# Patient Record
Sex: Female | Born: 2000 | Race: Black or African American | Hispanic: No | Marital: Single | Smoking: Never smoker
Health system: Southern US, Community
[De-identification: ages and names within clinical notes are randomized; demographics above are authoritative.]

## PROBLEM LIST (undated history)

## (undated) DIAGNOSIS — A749 Chlamydial infection, unspecified: Secondary | ICD-10-CM

## (undated) DIAGNOSIS — Z789 Other specified health status: Secondary | ICD-10-CM

## (undated) HISTORY — PX: NO PAST SURGERIES: SHX2092

---
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
## 2018-04-23 ENCOUNTER — Ambulatory Visit (HOSPITAL_COMMUNITY)
Admission: EM | Admit: 2018-04-23 | Discharge: 2018-04-23 | Disposition: A | Discharge status: Home / Self Care | Payer: Medicaid Other | Attending: Internal Medicine | Admitting: Internal Medicine

## 2018-04-23 ENCOUNTER — Encounter (HOSPITAL_COMMUNITY): Payer: Self-pay | Admitting: Emergency Medicine

## 2018-04-23 DIAGNOSIS — J069 Acute upper respiratory infection, unspecified: Secondary | ICD-10-CM

## 2018-04-23 DIAGNOSIS — B9789 Other viral agents as the cause of diseases classified elsewhere: Secondary | ICD-10-CM | POA: Diagnosis not present

## 2018-04-23 MED ORDER — IBUPROFEN 600 MG PO TABS: 600.0000 mg | ORAL_TABLET | Freq: Four times a day (QID) | ORAL | 0 refills | Status: DC | PRN

## 2018-04-23 NOTE — Discharge Instructions (Signed)
Get plenty of rest and push fluids You may use OTC zyrtec and/or flonase as needed for nasal congestion, runny nose, and/or sore throat Ibuprofen prescribed.  Take as directed.   Follow up with PCP or [facility] if symptoms persist Return or go to ER if you have any new or worsening symptoms fever, chills, nausea, vomiting, chest pain, cough, shortness of breath, wheezing, abdominal pain, changes in bowel or bladder habits, etc...  Heart rate also elevated in office today.  This could be secondary to viral illness and dehydration.  Please increase your water intake and follow up with PCP/ [facility] in 1-2 weeks for recheck.

## 2018-04-23 NOTE — ED Triage Notes (Signed)
Pt c/o flu like ysmptoms x1 week, body aches, chills, cough.

## 2018-04-23 NOTE — ED Provider Notes (Signed)
[facility]   [id number] 04/23/18 Arrival Time: 1712   CC: URI symptoms   SUBJECTIVE: History from: patient.  Vicki Harmon is a 18 y.o. female who presents with abrupt onset of nasal congestion, runny nose, cough, body aches, myalgias, subjective fever and chills x 3 days.  Admits to sick exposure at home.  Has tried mucinex without relief.  Denies aggravating factors.  Denies previous symptoms in the past.   Denies sinus pain, SOB, wheezing, chest pain, changes in bowel or bladder habits.    Received flu shot this year: no.  ROS: As per HPI.  Past Medical History:  Diagnosis Date  . Chlamydia   . Medical history non-contributory    Past Surgical History:  Procedure Laterality Date  . NO PAST SURGERIES     Allergies  Allergen Reactions  . Shellfish Allergy Swelling   No current facility-administered medications on file prior to encounter.    Current Outpatient Medications on File Prior to Encounter  Medication Sig Dispense Refill  . acetaminophen (TYLENOL) 500 MG tablet Take 1,000 mg by mouth every 6 (six) hours as needed for moderate pain or headache.     Social History   Socioeconomic History  . Marital status: Single    Spouse name: Not on file  . Number of children: Not on file  . Years of education: Not on file  . Highest education level: Not on file  Occupational History  . Not on file  Social Needs  . Financial resource strain: Not on file  . Food insecurity:    Worry: Not on file    Inability: Not on file  . Transportation needs:    Medical: Not on file    Non-medical: Not on file  Tobacco Use  . Smoking status: Never Smoker  . Smokeless tobacco: Never Used  Substance and Sexual Activity  . Alcohol use: No  . Drug use: Yes    Types: Marijuana    Comment: Quit in July  . Sexual activity: Never    Birth control/protection: Implant  Lifestyle  . Physical activity:    Days per week: Not on file    Minutes per session: Not on file  .  Stress: Not on file  Relationships  . Social connections:    Talks on phone: Not on file    Gets together: Not on file    Attends religious service: Not on file    Active member of club or organization: Not on file    Attends meetings of clubs or organizations: Not on file    Relationship status: Not on file  . Intimate partner violence:    Fear of current or ex partner: Not on file    Emotionally abused: Not on file    Physically abused: Not on file    Forced sexual activity: Not on file  Other Topics Concern  . Not on file  Social History Narrative  . Not on file   Family History  Problem Relation Age of Onset  . Hyperlipidemia Mother     OBJECTIVE:  Vitals:   04/23/18 1757  BP: (!) 105/56  Pulse: (!) 115  Resp: 18  Temp: 98.1 F (36.7 C)  SpO2: 100%     General appearance: alert; appears mildly fatigued, but nontoxic; speaking in full sentences and tolerating own secretions HEENT: NCAT; Ears: EACs clear, TMs pearly gray; Eyes: PERRL.  EOM grossly intact. Nose: nares patent without rhinorrhea, turbinates swollen and erythematous; Throat: oropharynx clear, tonsils non  erythematous or enlarged, uvula midline  Neck: supple without LAD Lungs: unlabored respirations, symmetrical air entry; cough: mild; no respiratory distress; CTAB Heart: tachycardic.  Radial pulses 2+ symmetrical bilaterally Skin: warm and dry Psychological: alert and cooperative; normal mood and affect  ASSESSMENT & PLAN:  1. Viral URI with cough     Meds ordered this encounter  Medications  . ibuprofen (ADVIL,MOTRIN) 600 MG tablet    Sig: Take 1 tablet (600 mg total) by mouth every 6 (six) hours as needed for fever (body aches, and pain).    Dispense:  30 tablet    Refill:  0    Order Specific Question:   Supervising Provider    Answer:   Eustace MooreELSON, YVONNE SUE [[id number]]   Get plenty of rest and push fluids You may use OTC zyrtec and/or flonase as needed for nasal congestion, runny nose, and/or  sore throat Ibuprofen prescribed.  Take as directed.   Follow up with PCP or [facility] if symptoms persist Return or go to ER if you have any new or worsening symptoms fever, chills, nausea, vomiting, chest pain, cough, shortness of breath, wheezing, abdominal pain, changes in bowel or bladder habits, etc...  Heart rate also elevated in office today.  This could be secondary to viral illness and dehydration.  Please increase your water intake and follow up with PCP/ [facility] in 1-2 weeks for recheck.   Reviewed expectations re: course of current medical issues. Questions answered. Outlined signs and symptoms indicating need for more acute intervention. Patient verbalized understanding. After Visit Summary given.         Rennis HardingWurst, [name], PA-C 04/23/18 1847

## 2018-11-15 ENCOUNTER — Emergency Department (HOSPITAL_COMMUNITY)
Admission: EM | Admit: 2018-11-15 | Discharge: 2018-11-15 | Discharge status: Absent without leave | Payer: Medicaid Other

## 2019-03-28 ENCOUNTER — Ambulatory Visit (HOSPITAL_COMMUNITY)
Admission: EM | Admit: 2019-03-28 | Discharge: 2019-03-28 | Disposition: A | Discharge status: Home / Self Care | Payer: Medicaid Other | Attending: Urgent Care | Admitting: Urgent Care

## 2019-03-28 ENCOUNTER — Encounter (HOSPITAL_COMMUNITY): Payer: Self-pay

## 2019-03-28 ENCOUNTER — Other Ambulatory Visit: Payer: Self-pay

## 2019-03-28 DIAGNOSIS — Z3202 Encounter for pregnancy test, result negative: Secondary | ICD-10-CM | POA: Diagnosis not present

## 2019-03-28 DIAGNOSIS — N921 Excessive and frequent menstruation with irregular cycle: Secondary | ICD-10-CM | POA: Insufficient documentation

## 2019-03-28 LAB — POCT URINALYSIS DIP (DEVICE)
Glucose, UA: 100 mg/dL — AB
Leukocytes,Ua: NEGATIVE
Nitrite: NEGATIVE
Protein, ur: 100 mg/dL — AB
Specific Gravity, Urine: 1.025 (ref 1.005–1.030)
Urobilinogen, UA: 1 mg/dL (ref 0.0–1.0)
pH: 7 (ref 5.0–8.0)

## 2019-03-28 LAB — POC URINE PREG, ED: Preg Test, Ur: NEGATIVE

## 2019-03-28 LAB — POCT PREGNANCY, URINE: Preg Test, Ur: NEGATIVE

## 2019-03-28 MED ORDER — NAPROXEN 500 MG PO TABS: 500.0000 mg | ORAL_TABLET | Freq: Two times a day (BID) | ORAL | 0 refills | Status: DC

## 2019-03-28 NOTE — Discharge Instructions (Addendum)
Call centers for women to follow for further evaluation. If symptoms worsens and you are soaking 1 pad per hour, go immediately to the emergency department for evaluation. Other take medication as prescribed and hydrate well with water. Results of your labs will go immediately to your Mychart.

## 2019-03-28 NOTE — ED Provider Notes (Signed)
[facility]    CSN: [id number] Arrival date & time: 03/28/19  1342      History   Chief Complaint Chief Complaint  Patient presents with  . Menstrual Problem    HPI Vicki Harmon is a 19 y.o. female.   HPI  Vicki Harmon presents with a concern for abnormal vaginal bleeding.  Patient reports irregular menstrual cycle due to implantable birth control. She reports having a menstrual cycle approximately every other month. Her currently period started eight days ago. She is concern as bleeding is heavier than usual and bleeding lasting 8 days oppose to 4-5 days. At present, she reports use of 4 regular pads per day. She is experiencing cramping lower abdomen consist with menstrual cramps. She has not taken any ibuprofen or tylenol for cramping. She is sexually active without consistent use of barrier protection. Denies nausea, vomiting, diarrhea, dizziness, or weakness. Past Medical History:  Diagnosis Date  . Chlamydia   . Medical history non-contributory     Patient Active Problem List   Diagnosis Date Noted  . Headache disorder 10/30/2017    Past Surgical History:  Procedure Laterality Date  . NO PAST SURGERIES      OB History    Gravida  1   Para      Term      Preterm      AB      Living  0     SAB      TAB      Ectopic      Multiple      Live Births               Home Medications    Prior to Admission medications   Medication Sig Start Date End Date Taking? Authorizing Provider  acetaminophen (TYLENOL) 500 MG tablet Take 1,000 mg by mouth every 6 (six) hours as needed for moderate pain or headache.    [provider]  ibuprofen (ADVIL,MOTRIN) 600 MG tablet Take 1 tablet (600 mg total) by mouth every 6 (six) hours as needed for fever (body aches, and pain). 04/23/18   Rennis Harding, PA-C    Family History Family History  Problem Relation Age of Onset  . Hyperlipidemia Mother   . Hypertension Mother     Social  History Social History   Tobacco Use  . Smoking status: Never Smoker  . Smokeless tobacco: Never Used  Substance Use Topics  . Alcohol use: Yes    Comment: occ  . Drug use: Yes    Types: Marijuana    Comment: Quit in July     Allergies   Shellfish allergy Review of Systems Review of Systems Pertinent negatives listed in HPI Physical Exam Triage Vital Signs ED Triage Vitals  Enc Vitals Group     BP 03/28/19 1415 125/89     Pulse Rate 03/28/19 1415 85     Resp 03/28/19 1415 18     Temp 03/28/19 1415 98.3 F (36.8 C)     Temp Source 03/28/19 1415 Oral     SpO2 03/28/19 1415 100 %     Weight --      Height --      Head Circumference --      Peak Flow --      Pain Score 03/28/19 1413 6     Pain Loc --      Pain Edu? --      Excl. in GC? --    No data  found.  Updated Vital Signs BP 125/89 (BP Location: Left Arm)   Pulse 85   Temp 98.3 F (36.8 C) (Oral)   Resp 18   SpO2 100%   Visual Acuity Right Eye Distance:   Left Eye Distance:   Bilateral Distance:    Right Eye Near:   Left Eye Near:    Bilateral Near:     Physical Exam General appearance: alert, well developed, well nourished, cooperative and in no distress Head: Normocephalic, without obvious abnormality, atraumatic Respiratory: Respirations even and unlabored, normal respiratory rate Heart: rate and rhythm normal. No gallop or murmurs noted on exam  Abdomen: BS +, no distention, no rebound tenderness, no mass, no CVA tenderness Extremities: No gross deformities Skin: Skin color, texture, turgor normal. No rashes seen  Psych: Appropriate mood and affect. Neurologic: Alert, oriented to person, place, and time, thought content appropriate. UC Treatments / Results  Labs (all labs ordered are listed, but only abnormal results are displayed) Labs Reviewed  POCT URINALYSIS DIP (DEVICE) - Abnormal; Notable for the following components:      Result Value   Glucose, UA 100 (*)    Bilirubin Urine  SMALL (*)    Ketones, ur TRACE (*)    Hgb urine dipstick LARGE (*)    Protein, ur 100 (*)    All other components within normal limits  POC URINE PREG, ED  POC URINE PREG, ED  POCT PREGNANCY, URINE  CERVICOVAGINAL ANCILLARY ONLY    EKG   Radiology No results found.  Procedures Procedures (including critical care time)  Medications Ordered in UC Medications - No data to display  Initial Impression / Assessment and Plan / UC Course  I have reviewed the triage vital signs and the nursing notes.  Pertinent labs & imaging results that were available during my care of the patient were reviewed by me and considered in my medical decision making (see chart for details).   Abnormal menstrual cycle with heavy prolonged bleeding. Screened for STDs. Patient has experienced irregularity of menstrual cycle since placement of nexplanon. Bleeding is mainly heavy and clot like when patient uses the restroom after further inquiry. Urine pregnancy negative. STD screening pending. Naprosxyn prescribed BID PRN for abdominal cramping. Pt is [facility] with [facility], encouraged to call an schedule an appointment as patient would like nexplanon removed. Reassurance provided. Red flags discussed. Patient verbalized understanding and agreement with plan. Final Clinical Impressions(s) / UC Diagnoses   Final diagnoses:  Menorrhagia with irregular cycle     Discharge Instructions     Call centers for women to follow for further evaluation. If symptoms worsens and you are soaking 1 pad per hour, go immediately to the emergency department for evaluation. Other take medication as prescribed and hydrate well with water. Results of your labs will go immediately to your Mychart.    ED Prescriptions    Medication Sig Dispense Auth. Provider   naproxen (NAPROSYN) 500 MG tablet Take 1 tablet (500 mg total) by mouth 2 (two) times daily. 30 tablet Scot Jun, FNP     PDMP not reviewed this  encounter.   Scot Jun, [place] 03/30/19 [id number]

## 2019-03-28 NOTE — ED Triage Notes (Signed)
Patient presents to Urgent Care with complaints of irregular period since the past few weeks. Patient reports she has a nexplanon in place, is due to come out next year. Pt endorses blood clots and cramping in her lower back. Pt states she has been wearing her son's diapers to absorb the bleeding. Pt was told by someone on the Nexplanon card to come here for assessment of possible miscarriage. Pt endorses unprotected sex.

## 2019-03-29 LAB — URINE CULTURE
Culture: <10000 — AB
Special Requests: NORMAL

## 2019-04-01 LAB — CERVICOVAGINAL ANCILLARY ONLY
Bacterial vaginitis: POSITIVE — AB
Candida vaginitis: NEGATIVE
Chlamydia: POSITIVE — AB
Neisseria Gonorrhea: NEGATIVE
Trichomonas: NEGATIVE

## 2019-04-02 ENCOUNTER — Encounter (HOSPITAL_COMMUNITY): Payer: Self-pay

## 2019-04-02 ENCOUNTER — Telehealth (HOSPITAL_COMMUNITY): Payer: Self-pay | Admitting: Emergency Medicine

## 2019-04-02 MED ORDER — AZITHROMYCIN 250 MG PO TABS: 1000.0000 mg | ORAL_TABLET | Freq: Once | ORAL | 0 refills | Status: AC

## 2019-04-02 MED ORDER — METRONIDAZOLE 500 MG PO TABS: 500.0000 mg | ORAL_TABLET | Freq: Two times a day (BID) | ORAL | 0 refills | Status: AC

## 2019-04-02 NOTE — Telephone Encounter (Signed)
Chlamydia is positive.  Rx po zithromax 1g #1 dose no refills was sent to the pharmacy of record.  Please refrain from sexual intercourse for 7 days to give the medicine time to work, sexual partners need to be notified and tested/treated.  Condoms may reduce risk of reinfection.  Recheck or followup with PCP for further evaluation if symptoms are not improving.   [facility] notified.  Bacterial vaginosis is positive. Pt needs treatment. Flagyl 500 mg BID x 7 days #14 no refills sent to patients pharmacy of choice.    Attempted to reach patient. No answer at this time. No number on file working.

## 2019-04-06 ENCOUNTER — Telehealth (HOSPITAL_COMMUNITY): Payer: Self-pay | Admitting: Emergency Medicine

## 2019-04-06 NOTE — Telephone Encounter (Signed)
Attempted number again, no number working. Letter sent.

## 2019-05-08 ENCOUNTER — Encounter (HOSPITAL_COMMUNITY): Payer: Self-pay

## 2019-05-08 ENCOUNTER — Other Ambulatory Visit: Payer: Self-pay

## 2019-05-08 ENCOUNTER — Ambulatory Visit (HOSPITAL_COMMUNITY)
Admission: EM | Admit: 2019-05-08 | Discharge: 2019-05-08 | Disposition: A | Discharge status: Home / Self Care | Payer: Medicaid Other | Attending: Family Medicine | Admitting: Family Medicine

## 2019-05-08 DIAGNOSIS — Z113 Encounter for screening for infections with a predominantly sexual mode of transmission: Secondary | ICD-10-CM | POA: Diagnosis not present

## 2019-05-08 DIAGNOSIS — N898 Other specified noninflammatory disorders of vagina: Secondary | ICD-10-CM

## 2019-05-08 LAB — POCT URINALYSIS DIP (DEVICE)
Bilirubin Urine: NEGATIVE
Glucose, UA: NEGATIVE mg/dL
Hgb urine dipstick: NEGATIVE
Ketones, ur: NEGATIVE mg/dL
Leukocytes,Ua: NEGATIVE
Nitrite: NEGATIVE
Protein, ur: NEGATIVE mg/dL
Specific Gravity, Urine: >=1.03 (ref 1.005–1.030)
Urobilinogen, UA: 0.2 mg/dL (ref 0.0–1.0)
pH: 7 (ref 5.0–8.0)

## 2019-05-08 MED ORDER — FLUCONAZOLE 200 MG PO TABS: 200.0000 mg | ORAL_TABLET | Freq: Once | ORAL | 0 refills | Status: AC

## 2019-05-08 NOTE — ED Triage Notes (Signed)
Pt was here on 03/28/2019 for STD testing, she did test POSITIVE for BV, Chlymdia & she took all of her meds for treatment. States her partner came & was tested but did not follow treatment(take prescribed meds). She wants STD testing again & treatment, they last had unprotected sex 2 days ago.

## 2019-05-08 NOTE — Discharge Instructions (Addendum)
Do not have sex until results are back and negative.  Your STD tests are pending.  If your test results are positive, we will call you.  You may need additional treatment and your partner(s) may also need treatment.

## 2019-05-08 NOTE — ED Provider Notes (Signed)
[place]    CSN: [id number] Arrival date & time: 05/08/19  1317      History   Chief Complaint Chief Complaint  Patient presents with  . S74.5    HPI Vicki Harmon is a 19 y.o. female.   Reports vaginal irritation, when she is in the shower and just water touches her labia she reports burning.  Reports that she was treated for chlamydia last month as well as her husband.  She states that they both completed the treatment regimen.  She is requesting STD testing again today.  Denies vaginal discharge, odor, pain with sex, abnormal period.  Denies fever, cough, headache, nausea, vomiting, diarrhea, chills, rash, other symptoms.  ROS Per HPI  The history is provided by the patient.    Past Medical History:  Diagnosis Date  . Chlamydia   . Medical history non-contributory     Patient Active Problem List   Diagnosis Date Noted  . Headache disorder 10/30/2017    Past Surgical History:  Procedure Laterality Date  . NO PAST SURGERIES      OB History    Gravida  1   Para      Term      Preterm      AB      Living  0     SAB      TAB      Ectopic      Multiple      Live Births               Home Medications    Prior to Admission medications   Medication Sig Start Date End Date Taking? Authorizing Provider  acetaminophen (TYLENOL) 500 MG tablet Take 1,000 mg by mouth every 6 (six) hours as needed for moderate pain or headache.    [provider]  fluconazole (DIFLUCAN) 200 MG tablet Take 1 tablet (200 mg total) by mouth once for 1 dose. 05/08/19 05/08/19  Faustino Congress, NP  ibuprofen (ADVIL,MOTRIN) 600 MG tablet Take 1 tablet (600 mg total) by mouth every 6 (six) hours as needed for fever (body aches, and pain). 04/23/18   Wurst, [place], PA-C  naproxen (NAPROSYN) 500 MG tablet Take 1 tablet (500 mg total) by mouth 2 (two) times daily. 03/28/19   Scot Jun, FNP    Family History Family History  Problem Relation  Age of Onset  . Hyperlipidemia Mother   . Hypertension Mother     Social History Social History   Tobacco Use  . Smoking status: Never Smoker  . Smokeless tobacco: Never Used  Substance Use Topics  . Alcohol use: Yes    Comment: occ  . Drug use: Yes    Types: Marijuana     Allergies   Shellfish allergy   Review of Systems Review of Systems   Physical Exam Triage Vital Signs ED Triage Vitals [05/08/19 1335]  Enc Vitals Group     BP      Pulse      Resp      Temp      Temp src      SpO2      Weight 148 lb 12.8 oz (67.5 kg)     Height      Head Circumference      Peak Flow      Pain Score      Pain Loc      Pain Edu?      Excl. in [place]?  No data found.  Updated Vital Signs BP 120/66 (BP Location: Left Arm)   Pulse 77   Temp 99 F (37.2 C) (Oral)   Resp 17   Wt 148 lb 12.8 oz (67.5 kg)   SpO2 99%   Breastfeeding No   BMI 22.62 kg/m       Physical Exam Vitals and nursing note reviewed.  Constitutional:      General: She is not in acute distress.    Appearance: Normal appearance. She is well-developed and normal weight.  HENT:     Head: Normocephalic and atraumatic.  Eyes:     Conjunctiva/sclera: Conjunctivae normal.  Cardiovascular:     Rate and Rhythm: Normal rate and regular rhythm.     Heart sounds: Normal heart sounds. No murmur.  Pulmonary:     Effort: Pulmonary effort is normal. No respiratory distress.     Breath sounds: Normal breath sounds.  Abdominal:     General: Bowel sounds are normal. There is no distension.     Palpations: Abdomen is soft. There is no mass.     Tenderness: There is no abdominal tenderness. There is no guarding.     Hernia: No hernia is present.  Musculoskeletal:        General: Normal range of motion.     Cervical back: Neck supple.  Skin:    General: Skin is warm and dry.  Neurological:     General: No focal deficit present.     Mental Status: She is alert and oriented to person, place, and time.    Psychiatric:        Mood and Affect: Mood normal.        Behavior: Behavior normal.      UC Treatments / Results  Labs (all labs ordered are listed, but only abnormal results are displayed) Labs Reviewed  POCT URINALYSIS DIP (DEVICE)  CERVICOVAGINAL ANCILLARY ONLY    EKG   Radiology No results found.  Procedures Procedures (including critical care time)  Medications Ordered in UC Medications - No data to display  Initial Impression / Assessment and Plan / UC Course  I have reviewed the triage vital signs and the nursing notes.  Pertinent labs & imaging results that were available during my care of the patient were reviewed by me and considered in my medical decision making (see chart for details).     Possible yeast, from antibiotic treatment for chlamydia and BV.  STD testing swab obtained today, will call patient with results.  Instructed to abstain from sex until results are back and negative.  UA in office negative.  Instructed to follow-up with primary care as needed. Final Clinical Impressions(s) / UC Diagnoses   Final diagnoses:  Screen for STD (sexually transmitted disease)  Vaginal irritation     Discharge Instructions     Do not have sex until results are back and negative.  Your STD tests are pending.  If your test results are positive, we will call you.  You may need additional treatment and your partner(s) may also need treatment.         ED Prescriptions    Medication Sig Dispense Auth. Provider   fluconazole (DIFLUCAN) 200 MG tablet Take 1 tablet (200 mg total) by mouth once for 1 dose. 2 tablet Moshe Cipro, NP     I have reviewed the PDMP during this encounter.   Moshe Cipro, NP 05/08/19 1429

## 2019-05-12 LAB — CERVICOVAGINAL ANCILLARY ONLY
Bacterial vaginitis: NEGATIVE
Candida vaginitis: NEGATIVE
Chlamydia: NEGATIVE
Neisseria Gonorrhea: NEGATIVE
Trichomonas: NEGATIVE

## 2019-07-07 ENCOUNTER — Ambulatory Visit (HOSPITAL_COMMUNITY)
Admission: EM | Admit: 2019-07-07 | Discharge: 2019-07-07 | Disposition: A | Discharge status: Home / Self Care | Payer: Medicaid Other | Attending: Family Medicine | Admitting: Family Medicine

## 2019-07-07 ENCOUNTER — Other Ambulatory Visit: Payer: Self-pay

## 2019-07-07 ENCOUNTER — Encounter (HOSPITAL_COMMUNITY): Payer: Self-pay

## 2019-07-07 DIAGNOSIS — M654 Radial styloid tenosynovitis [de Quervain]: Secondary | ICD-10-CM

## 2019-07-07 MED ORDER — METHYLPREDNISOLONE 4 MG PO TBPK: ORAL_TABLET | ORAL | 0 refills | Status: DC

## 2019-07-07 NOTE — ED Provider Notes (Signed)
[place]    CSN: [id number] Arrival date & time: 07/07/19  1537      History   Chief Complaint Chief Complaint  Patient presents with  . Hand Pain    HPI Vicki Harmon is a 19 y.o. female.   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 Chlamydia   . Medical history non-contributory     Patient Active Problem List   Diagnosis Date Noted  . Headache disorder 10/30/2017    Past Surgical History:  Procedure Laterality Date  . NO PAST SURGERIES      OB History    Gravida  1   Para      Term      Preterm      AB      Living  0     SAB      TAB      Ectopic      Multiple      Live Births               Home Medications    Prior to Admission medications   Medication Sig Start Date End Date Taking? Authorizing Provider  acetaminophen (TYLENOL) 500 MG tablet Take 1,000 mg by mouth every 6 (six) hours as needed for moderate pain or headache.    [provider]  methylPREDNISolone (MEDROL DOSEPAK) 4 MG TBPK tablet tad 07/07/19   Raylene Everts, MD    Family History Family History  Problem Relation Age of Onset  . Hyperlipidemia Mother   . Hypertension Mother     Social History Social History   Tobacco Use  . Smoking status: Never Smoker  . Smokeless tobacco: Never Used  Substance Use Topics  . Alcohol use: Yes    Comment: occ  . Drug use: Yes    Types: Marijuana     Allergies   Shellfish allergy   Review of Systems Review of Systems  Musculoskeletal:       Right hand pain     Physical Exam Triage Vital Signs ED Triage Vitals [07/07/19 1552]  Enc Vitals  Group     BP 117/72     Pulse Rate 100     Resp 18     Temp 98.1 F (36.7 C)     Temp Source Oral     SpO2 100 %     Weight      Height      Head Circumference      Peak Flow      Pain Score 5     Pain Loc      Pain Edu?      Excl. in [place]?    No data found.  Updated Vital Signs BP 117/72 (BP Location: Right Arm)   Pulse 100   Temp 98.1 F (36.7 C) (Oral)   Resp 18   SpO2 100%      Physical Exam Constitutional:      General: She is not in acute distress.    Appearance: Normal appearance. She is well-developed and normal weight.  HENT:     Head: Normocephalic and  atraumatic.     Nose:     Comments: Mask is in place Eyes:     Conjunctiva/sclera: Conjunctivae normal.     Pupils: Pupils are equal, round, and reactive to light.  Cardiovascular:     Rate and Rhythm: Normal rate.  Pulmonary:     Effort: Pulmonary effort is normal. No respiratory distress.  Musculoskeletal:        General: Normal range of motion.     Cervical back: Normal range of motion.     Comments: Both hands are examined.  Left hand is normal.  Right hand has normal dexterity and strength.  Normal sensation.  Negative Phalen's and Tinel's.  Positive Finkelstein's.  Skin:    General: Skin is warm and dry.  Neurological:     General: No focal deficit present.     Mental Status: She is alert.  Psychiatric:        Mood and Affect: Mood normal.        Behavior: Behavior normal.      UC Treatments / Results  Labs (all labs ordered are listed, but only abnormal results are displayed) Labs Reviewed - No data to display  EKG   Radiology No results found.  Procedures Procedures (including critical care time)  Medications Ordered in UC Medications - No data to display  Initial Impression / Assessment and Plan / UC Course  I have reviewed the triage vital signs and the nursing notes.  Pertinent labs & imaging results that were available during my care of the patient were reviewed by me  and considered in my medical decision making (see chart for details).     Patient has de Quervain's tenosynovitis.  From repetitive motion opening boxes with a razor knife.  She is told to follow-up with her supervisor at work.  Treatment includes rest ice brace and anti-inflammatories Final Clinical Impressions(s) / UC Diagnoses   Final diagnoses:  Radial styloid tenosynovitis (de quervain)     Discharge Instructions     Ice to area Wear brace Take the medrol as directed Limit heavy use of hand Follow up with your supervisor for workers comp evaluation    ED Prescriptions    Medication Sig Dispense Auth. Provider   methylPREDNISolone (MEDROL DOSEPAK) 4 MG TBPK tablet tad 21 tablet Eustace Moore, MD     PDMP not reviewed this encounter.   Eustace Moore, MD 07/07/19 [phone]

## 2019-07-07 NOTE — ED Triage Notes (Signed)
Pt presents with right hand pain since yesterday not injury related.

## 2019-07-07 NOTE — Discharge Instructions (Signed)
Ice to area Wear brace Take the medrol as directed Limit heavy use of hand Follow up with your supervisor for workers comp evaluation

## 2019-07-14 ENCOUNTER — Other Ambulatory Visit: Payer: Self-pay

## 2019-07-14 ENCOUNTER — Encounter (HOSPITAL_COMMUNITY): Payer: Self-pay

## 2019-07-14 ENCOUNTER — Ambulatory Visit (HOSPITAL_COMMUNITY)
Admission: EM | Admit: 2019-07-14 | Discharge: 2019-07-14 | Disposition: A | Discharge status: Home / Self Care | Payer: Medicaid Other | Attending: Family Medicine | Admitting: Family Medicine

## 2019-07-14 DIAGNOSIS — M654 Radial styloid tenosynovitis [de Quervain]: Secondary | ICD-10-CM | POA: Diagnosis not present

## 2019-07-14 NOTE — ED Triage Notes (Signed)
Pt presents for note to return to work.  

## 2019-07-15 NOTE — ED Provider Notes (Signed)
  [facility]   [id number] 07/14/19 Arrival Time: 1541  ASSESSMENT & PLAN:  1. De Quervain's tenosynovitis, right     Feeling better. Return to work provided. Splint as needed.  Follow-up Information    [facility].   Specialty: Urgent Care Why: As needed. Contact information: [address] [place] [address] [phone]          Reviewed expectations re: course of current medical issues. Questions answered. Outlined signs and symptoms indicating need for more acute intervention. Understanding verbalized. After Visit Summary given.   SUBJECTIVE: History from: patient. Vicki Harmon is a 19 y.o. female who was seen here recently for tenosynovitis on the R. Has worn splint with much improvements. Requests note to return to work. "Feel great now".    OBJECTIVE:  Vitals:   07/14/19 1555  BP: 109/62  Pulse: 80  Resp: 18  Temp: 98.1 F (36.7 C)  TempSrc: Oral  SpO2: 100%    General appearance: alert; no distress Extremities: no edema; RUE normal Skin: warm and dry Psychological: alert and cooperative; normal mood and affect    Allergies  Allergen Reactions  . Shellfish Allergy Swelling    Past Medical History:  Diagnosis Date  . Chlamydia   . Medical history non-contributory    Social History   Socioeconomic History  . Marital status: Single    Spouse name: Not on file  . Number of children: Not on file  . Years of education: Not on file  . Highest education level: Not on file  Occupational History  . Not on file  Tobacco Use  . Smoking status: Never Smoker  . Smokeless tobacco: Never Used  Substance and Sexual Activity  . Alcohol use: Yes    Comment: occ  . Drug use: Yes    Types: Marijuana  . Sexual activity: Yes    Birth control/protection: Implant  Other Topics Concern  . Not on file  Social History Narrative  . Not on file   Social Determinants of Health    Financial Resource Strain:   . Difficulty of Paying Living Expenses:   Food Insecurity:   . Worried About Programme researcher, broadcasting/film/video in the Last Year:   . Barista in the Last Year:   Transportation Needs:   . Freight forwarder (Medical):   Marland Kitchen Lack of Transportation (Non-Medical):   Physical Activity:   . Days of Exercise per Week:   . Minutes of Exercise per Session:   Stress:   . Feeling of Stress :   Social Connections:   . Frequency of Communication with Friends and Family:   . Frequency of Social Gatherings with Friends and Family:   . Attends Religious Services:   . Active Member of Clubs or Organizations:   . Attends Banker Meetings:   Marland Kitchen Marital Status:   Intimate Partner Violence:   . Fear of Current or Ex-Partner:   . Emotionally Abused:   Marland Kitchen Physically Abused:   . Sexually Abused:    Family History  Problem Relation Age of Onset  . Hyperlipidemia Mother   . Hypertension Mother    Past Surgical History:  Procedure Laterality Date  . NO PAST SURGERIES       Mardella Layman, MD 07/15/19 1112

## 2019-09-14 ENCOUNTER — Ambulatory Visit (HOSPITAL_COMMUNITY)
Admission: EM | Admit: 2019-09-14 | Discharge: 2019-09-14 | Disposition: A | Discharge status: Home / Self Care | Payer: Medicaid Other | Attending: Family Medicine | Admitting: Family Medicine

## 2019-09-14 ENCOUNTER — Encounter (HOSPITAL_COMMUNITY): Payer: Self-pay

## 2019-09-14 DIAGNOSIS — N946 Dysmenorrhea, unspecified: Secondary | ICD-10-CM | POA: Insufficient documentation

## 2019-09-14 DIAGNOSIS — Z3202 Encounter for pregnancy test, result negative: Secondary | ICD-10-CM

## 2019-09-14 DIAGNOSIS — R109 Unspecified abdominal pain: Secondary | ICD-10-CM

## 2019-09-14 LAB — POCT URINALYSIS DIP (DEVICE)
Bilirubin Urine: NEGATIVE
Glucose, UA: NEGATIVE mg/dL
Ketones, ur: NEGATIVE mg/dL
Leukocytes,Ua: NEGATIVE
Nitrite: NEGATIVE
Protein, ur: 30 mg/dL — AB
Specific Gravity, Urine: 1.025 (ref 1.005–1.030)
Urobilinogen, UA: 4 mg/dL — ABNORMAL HIGH (ref 0.0–1.0)
pH: 7 (ref 5.0–8.0)

## 2019-09-14 LAB — POC URINE PREG, ED: Preg Test, Ur: NEGATIVE

## 2019-09-14 MED ORDER — NAPROXEN 500 MG PO TABS: 500.0000 mg | ORAL_TABLET | Freq: Two times a day (BID) | ORAL | 0 refills | Status: DC

## 2019-09-14 NOTE — ED Triage Notes (Signed)
Pt reports having abdominal pain "feel like contraction', light vaginal bleeding since yesterday. Pt has not take any medication for the complains.

## 2019-09-14 NOTE — Discharge Instructions (Signed)
Naprosyn twice daily for pain/cramping  Follow up with OBGYN for concerns about nexplanon  We are testing you for Gonorrhea, Chlamydia, Trichomonas, Yeast and Bacterial Vaginosis. We will call you if anything is positive and let you know if you require any further treatment. Please inform partners of any positive results.   Please return if symptoms not improving with treatment, development of fever, nausea, vomiting, abdominal pain.

## 2019-09-15 LAB — CERVICOVAGINAL ANCILLARY ONLY
Bacterial Vaginitis (gardnerella): NEGATIVE
Candida Glabrata: NEGATIVE
Candida Vaginitis: NEGATIVE
Chlamydia: NEGATIVE
Comment: NEGATIVE
Comment: NEGATIVE
Comment: NEGATIVE
Comment: NEGATIVE
Comment: NEGATIVE
Comment: NORMAL
Neisseria Gonorrhea: NEGATIVE
Trichomonas: NEGATIVE

## 2019-09-16 NOTE — ED Provider Notes (Signed)
[facility]    CSN: [id number] Arrival date & time: 09/14/19  1808      History   Chief Complaint Chief Complaint  Patient presents with  . Abdominal Pain    HPI Vicki Harmon is a 18 y.o. female presenting today for evaluation of abdominal pain.  Patient reports that over the past day or 2 she has had lower abdominal pain/cramping.  Patient started her menstrual cycle yesterday.  Reports that she has irregular cycles due to the Nexplanon.  She expresses concern about the Nexplanon remaining in her arm and would like to have it removed.  Reports that her cycle waxes and wanes from light and heavy prior has been light at times, but also passing clots at times.  She denies any other pelvic symptoms of abnormal discharge, itching or irritation.  Denies urinary symptoms.  Denies nausea or vomiting.  HPI  Past Medical History:  Diagnosis Date  . Chlamydia   . Medical history non-contributory     Patient Active Problem List   Diagnosis Date Noted  . Headache disorder 10/30/2017    Past Surgical History:  Procedure Laterality Date  . NO PAST SURGERIES      OB History    Gravida  1   Para      Term      Preterm      AB      Living  0     SAB      TAB      Ectopic      Multiple      Live Births               Home Medications    Prior to Admission medications   Medication Sig Start Date End Date Taking? Authorizing Provider  etonogestrel (NEXPLANON) 68 MG IMPL implant 1 each by Subdermal route once.   Yes [provider]  naproxen (NAPROSYN) 500 MG tablet Take 1 tablet (500 mg total) by mouth 2 (two) times daily. 09/14/19   Vear Staton, Junius Creamer, PA-C    Family History Family History  Problem Relation Age of Onset  . Hyperlipidemia Mother   . Hypertension Mother     Social History Social History   Tobacco Use  . Smoking status: Never Smoker  . Smokeless tobacco: Never Used  Vaping Use  . Vaping Use: Never used  Substance Use  Topics  . Alcohol use: Yes    Comment: occ  . Drug use: Yes    Types: Marijuana     Allergies   Shellfish allergy   Review of Systems Review of Systems  Constitutional: Negative for fever.  Respiratory: Negative for shortness of breath.   Cardiovascular: Negative for chest pain.  Gastrointestinal: Positive for abdominal pain. Negative for diarrhea, nausea and vomiting.  Genitourinary: Positive for menstrual problem and vaginal bleeding. Negative for dysuria, flank pain, genital sores, hematuria, vaginal discharge and vaginal pain.  Musculoskeletal: Negative for back pain.  Skin: Negative for rash.  Neurological: Negative for dizziness, light-headedness and headaches.     Physical Exam Triage Vital Signs ED Triage Vitals  Enc Vitals Group     BP 09/14/19 1856 128/76     Pulse Rate 09/14/19 1856 74     Resp 09/14/19 1856 18     Temp 09/14/19 1856 99.3 F (37.4 C)     Temp Source 09/14/19 1856 Oral     SpO2 09/14/19 1856 100 %     Weight --  Height --      Head Circumference --      Peak Flow --      Pain Score 09/14/19 1857 8     Pain Loc --      Pain Edu? --      Excl. in [place]? --    No data found.  Updated Vital Signs BP 128/76 (BP Location: Right Arm)   Pulse 74   Temp 99.3 F (37.4 C) (Oral)   Resp 18   LMP 09/13/2019 (Exact Date)   SpO2 100%   Visual Acuity Right Eye Distance:   Left Eye Distance:   Bilateral Distance:    Right Eye Near:   Left Eye Near:    Bilateral Near:     Physical Exam Vitals and nursing note reviewed.  Constitutional:      Appearance: She is well-developed.     Comments: No acute distress  HENT:     Head: Normocephalic and atraumatic.     Nose: Nose normal.  Eyes:     Conjunctiva/sclera: Conjunctivae normal.  Cardiovascular:     Rate and Rhythm: Normal rate.  Pulmonary:     Effort: Pulmonary effort is normal. No respiratory distress.     Comments: Breathing comfortably at rest, CTABL, no wheezing, rales or  other adventitious sounds auscultated Abdominal:     General: There is no distension.     Comments: Soft, nondistended, nontender to palpation, increased tenderness to palpation quadrants, slight vomiting on left, negative rebound, negative Rovsing, negative McBurney's  Genitourinary:    Comments: Normal external female genitalia, no rashes or lesions noted externally, vaginal mucosa pink, cervix pink no obvious discharge noted, small amount of bright red blood present Musculoskeletal:        General: Normal range of motion.     Cervical back: Neck supple.  Skin:    General: Skin is warm and dry.  Neurological:     Mental Status: She is alert and oriented to person, place, and time.      UC Treatments / Results  Labs (all labs ordered are listed, but only abnormal results are displayed) Labs Reviewed  POCT URINALYSIS DIP (DEVICE) - Abnormal; Notable for the following components:      Result Value   Hgb urine dipstick LARGE (*)    Protein, ur 30 (*)    Urobilinogen, UA 4.0 (*)    All other components within normal limits  POC URINE PREG, ED  CERVICOVAGINAL ANCILLARY ONLY    EKG   Radiology No results found.  Procedures Procedures (including critical care time)  Medications Ordered in UC Medications - No data to display  Initial Impression / Assessment and Plan / UC Course  I have reviewed the triage vital signs and the nursing notes.  Pertinent labs & imaging results that were available during my care of the patient were reviewed by me and considered in my medical decision making (see chart for details).    Pregnancy test negative, UA negative for signs of infection.  Vaginal swab pending to screen for STDs for any possible cause of infection contributing to increased pain for cycles.  Treating for dysmenorrhea in the interim with Naprosyn twice daily.  Recommended following up with OB/GYN for further discussion about Nexplanon removal as well as if continuing  pain.  Discussed strict return precautions. Patient verbalized understanding and is agreeable with plan.     Final Clinical Impressions(s) / UC Diagnoses   Final diagnoses:  Dysmenorrhea  Discharge Instructions     Naprosyn twice daily for pain/cramping  Follow up with OBGYN for concerns about nexplanon  We are testing you for Gonorrhea, Chlamydia, Trichomonas, Yeast and Bacterial Vaginosis. We will call you if anything is positive and let you know if you require any further treatment. Please inform partners of any positive results.   Please return if symptoms not improving with treatment, development of fever, nausea, vomiting, abdominal pain.    ED Prescriptions    Medication Sig Dispense Auth. Provider   naproxen (NAPROSYN) 500 MG tablet Take 1 tablet (500 mg total) by mouth 2 (two) times daily. 30 tablet Ciara Kagan, [place] C, PA-C     PDMP not reviewed this encounter.   Lew Dawes, [place] 09/16/19 [phone]

## 2019-09-25 ENCOUNTER — Ambulatory Visit: Payer: Medicaid Other | Admitting: Family Medicine

## 2019-09-25 ENCOUNTER — Other Ambulatory Visit: Payer: Self-pay

## 2019-09-25 ENCOUNTER — Encounter: Payer: Self-pay | Admitting: Obstetrics

## 2019-09-25 ENCOUNTER — Encounter: Payer: Self-pay | Admitting: Family Medicine

## 2019-09-25 VITALS — BP 126/81 | HR 65 | Wt 138.0 lb

## 2019-09-25 DIAGNOSIS — Z3009 Encounter for other general counseling and advice on contraception: Secondary | ICD-10-CM | POA: Diagnosis not present

## 2019-09-25 DIAGNOSIS — Z3046 Encounter for surveillance of implantable subdermal contraceptive: Secondary | ICD-10-CM | POA: Diagnosis not present

## 2019-09-25 MED ORDER — MEDROXYPROGESTERONE ACETATE 150 MG/ML IM SUSP: 150.0000 mg | Freq: Once | INTRAMUSCULAR | 4 refills | Status: DC

## 2019-09-25 MED ORDER — MEDROXYPROGESTERONE ACETATE 150 MG/ML IM SUSP
150.0000 mg | INTRAMUSCULAR | Status: DC
  Administered 2019-09-25: 150 mg via INTRAMUSCULAR

## 2019-09-25 NOTE — Progress Notes (Signed)
     GYNECOLOGY OFFICE PROCEDURE NOTE  [name] is a 19 y.o. G1P1 here for Nexplanon removal due to undesired side effect. No other gynecologic concerns.  Nexplanon Removal Patient identified, informed consent performed, consent signed.   Appropriate time out taken. Nexplanon site identified.  Area prepped in usual sterile fashon.3 ml of 1% lidocaine was used to anesthetize the area at the distal end of the implant. A small stab incision was made right beside the implant on the distal portion.  The Nexplanon rod was grasped using hemostats and removed without difficulty.  There was minimal blood loss. There were no complications.  A pressure bandage was applied to reduce any bruising.  The patient tolerated the procedure well and was given post procedure instructions.  Patient is planning to use Depo for contraception conception which we will give today. She will f/u in 3 months for next shot and first Pap smear.  Jerilynn Birkenhead, MD [facility] Family Medicine Fellow, [facility] for [employer], [facility]

## 2019-09-25 NOTE — Progress Notes (Signed)
Pt requests Nexplanon removal d/t dysmenorrhea.  Nexplanon was inserted 2019 Pt wants BC but unsure which one

## 2019-12-16 ENCOUNTER — Ambulatory Visit: Payer: Medicaid Other

## 2019-12-23 ENCOUNTER — Ambulatory Visit: Payer: Medicaid Other

## 2020-03-12 NOTE — L&D Delivery Note (Addendum)
OB/GYN Faculty Practice Delivery Note  Vicy Medico is a 20 y.o. G2P1001 s/p SVD at [redacted]w[redacted]d. She was admitted for spontaneous onset of labor, later developed gHTN.   ROM: 7h 73m with clear fluid GBS Status: neg Maximum Maternal Temperature: 98.4  Labor Progress: Presented in spontaneous labor, however her contractions spaced out after epidural and received pitocin for augmentation. She ultimately SROMed and progressed to complete  Delivery Date/Time: 0130 on 12/5 Delivery: Called to room and patient was complete and pushing. Head delivered LOA. Loose nuchal cord present and delivered through. Shoulder and body delivered in usual fashion. Infant with spontaneous cry, placed on mother's abdomen, dried and stimulated. Cord clamped x 2 after 1-minute delay, and cut by father of baby. Cord blood drawn. Placenta delivered spontaneously with gentle cord traction. Fundus firm with massage and Pitocin. Labia, perineum, vagina, and cervix inspected and found to be intact.   Placenta: intact, 3V cord, sent to L&D Complications: None Lacerations: None  EBL: 200cc Analgesia: epidural   Infant: female  APGARs 9,9  weight pending  Warner Mccreedy, MD, MPH OB Fellow, Faculty Practice Center for [facility], [facility] 02/13/2021, 1:52 AM

## 2020-06-27 ENCOUNTER — Encounter (HOSPITAL_COMMUNITY): Payer: Self-pay | Admitting: Emergency Medicine

## 2020-06-27 ENCOUNTER — Other Ambulatory Visit: Payer: Self-pay

## 2020-06-27 ENCOUNTER — Emergency Department (HOSPITAL_COMMUNITY)
Admission: EM | Admit: 2020-06-27 | Discharge: 2020-06-27 | Disposition: A | Discharge status: Home / Self Care | Payer: Medicaid Other | Attending: Physician Assistant | Admitting: Physician Assistant

## 2020-06-27 DIAGNOSIS — R112 Nausea with vomiting, unspecified: Secondary | ICD-10-CM | POA: Diagnosis not present

## 2020-06-27 DIAGNOSIS — Z5321 Procedure and treatment not carried out due to patient leaving prior to being seen by health care provider: Secondary | ICD-10-CM | POA: Insufficient documentation

## 2020-06-27 DIAGNOSIS — R109 Unspecified abdominal pain: Secondary | ICD-10-CM | POA: Insufficient documentation

## 2020-06-27 LAB — CBC WITH DIFFERENTIAL/PLATELET
Abs Immature Granulocytes: 0.04 10*3/uL (ref 0.00–0.07)
Basophils Absolute: 0 10*3/uL (ref 0.0–0.1)
Basophils Relative: 0 %
Eosinophils Absolute: 0.1 10*3/uL (ref 0.0–0.5)
Eosinophils Relative: 1 %
HCT: 38.4 % (ref 36.0–46.0)
Hemoglobin: 12.4 g/dL (ref 12.0–15.0)
Immature Granulocytes: 0 %
Lymphocytes Relative: 31 %
Lymphs Abs: 2.8 10*3/uL (ref 0.7–4.0)
MCH: 27.7 pg (ref 26.0–34.0)
MCHC: 32.3 g/dL (ref 30.0–36.0)
MCV: 85.7 fL (ref 80.0–100.0)
Monocytes Absolute: 0.6 10*3/uL (ref 0.1–1.0)
Monocytes Relative: 6 %
Neutro Abs: 5.7 10*3/uL (ref 1.7–7.7)
Neutrophils Relative %: 62 %
Platelets: 256 10*3/uL (ref 150–400)
RBC: 4.48 MIL/uL (ref 3.87–5.11)
RDW: 13 % (ref 11.5–15.5)
WBC: 9.2 10*3/uL (ref 4.0–10.5)
nRBC: 0 % (ref 0.0–0.2)

## 2020-06-27 LAB — COMPREHENSIVE METABOLIC PANEL WITH GFR
ALT: 15 U/L (ref 0–44)
AST: 18 U/L (ref 15–41)
Albumin: 4.5 g/dL (ref 3.5–5.0)
Alkaline Phosphatase: 63 U/L (ref 38–126)
Anion gap: 8 (ref 5–15)
BUN: 6 mg/dL (ref 6–20)
CO2: 24 mmol/L (ref 22–32)
Calcium: 9.7 mg/dL (ref 8.9–10.3)
Chloride: 103 mmol/L (ref 98–111)
Creatinine, Ser: 0.7 mg/dL (ref 0.44–1.00)
GFR, Estimated: >60 mL/min
Glucose, Bld: 102 mg/dL — ABNORMAL HIGH (ref 70–99)
Potassium: 3.5 mmol/L (ref 3.5–5.1)
Sodium: 135 mmol/L (ref 135–145)
Total Bilirubin: 0.9 mg/dL (ref 0.3–1.2)
Total Protein: 8.2 g/dL — ABNORMAL HIGH (ref 6.5–8.1)

## 2020-06-27 LAB — URINALYSIS, ROUTINE W REFLEX MICROSCOPIC
Bilirubin Urine: NEGATIVE
Glucose, UA: NEGATIVE mg/dL
Hgb urine dipstick: NEGATIVE
Ketones, ur: 20 mg/dL — AB
Leukocytes,Ua: NEGATIVE
Nitrite: NEGATIVE
Protein, ur: NEGATIVE mg/dL
Specific Gravity, Urine: 1.027 (ref 1.005–1.030)
pH: 5 (ref 5.0–8.0)

## 2020-06-27 LAB — I-STAT BETA HCG BLOOD, ED (MC, WL, AP ONLY): I-stat hCG, quantitative: >2000 m[IU]/mL — ABNORMAL HIGH (ref <5)

## 2020-06-27 LAB — LIPASE, BLOOD: Lipase: 30 U/L (ref 11–51)

## 2020-06-27 NOTE — ED Triage Notes (Signed)
Pt c/o abdominal pain, nausea and vomiting x 4 days. Denies diarrhea.

## 2020-06-27 NOTE — ED Triage Notes (Signed)
Emergency Medicine Provider Triage Evaluation Note  Vicki Harmon , a 20 y.o. female  was evaluated in triage.  Pt complains of abd pain, nv. Denies urinary or vaginal complaints.   Review of Systems  Positive: abd pain, nv Negative: Diarrhea, urinary or vaginal complaints  Physical Exam  BP 113/64 (BP Location: Left Arm)   Pulse 80   Temp 98.2 F (36.8 C)   Resp 17   SpO2 100%  Gen:   Awake, no distress   HEENT:  Atraumatic  Resp:  Normal effort  Cardiac:  Normal rate  Abd:   Nondistended, no focal ttp MSK:   Moves extremities without difficulty  Neuro:  Speech clear   Medical Decision Making  Medically screening exam initiated at 8:23 PM.  Appropriate orders placed.  Vicki Harmon was informed that the remainder of the evaluation will be completed by another provider, this initial triage assessment does not replace that evaluation, and the importance of remaining in the ED until their evaluation is complete.  Clinical Impression  20 y/o f here with abd pain, nv  MSE was initiated and I personally evaluated the patient and placed orders (if any) at  8:25 PM on June 27, 2020.  The patient appears stable so that the remainder of the MSE may be completed by another provider.    Samson Frederic, Kylea Berrong S, PA-C 06/27/20 2025

## 2020-06-28 ENCOUNTER — Ambulatory Visit (INDEPENDENT_AMBULATORY_CARE_PROVIDER_SITE_OTHER): Payer: Medicaid Other | Admitting: *Deleted

## 2020-06-28 ENCOUNTER — Encounter: Payer: Self-pay | Admitting: Obstetrics and Gynecology

## 2020-06-28 DIAGNOSIS — Z3201 Encounter for pregnancy test, result positive: Secondary | ICD-10-CM | POA: Diagnosis not present

## 2020-06-28 DIAGNOSIS — Z3200 Encounter for pregnancy test, result unknown: Secondary | ICD-10-CM

## 2020-06-28 LAB — POCT URINE PREGNANCY: Preg Test, Ur: POSITIVE — AB

## 2020-06-28 NOTE — Progress Notes (Signed)
Ms. Territo presents today for UPT. She complains of Nausea and vomiting.  LMP:05/02/20    OBJECTIVE: Appears well, in no apparent distress.  OB History    Gravida  1   Para  1   Term  1   Preterm      AB      Living  1     SAB      IAB      Ectopic      Multiple      Live Births  1          Home UPT Result: Positive In-Office UPT result: Positive  I have reviewed the patient's medication and allergies.   ASSESSMENT: Positive pregnancy test  PLAN Prenatal care to be completed at:  [facility] Made aware she may take Unisom and Vitamin B6- 1 each at bedtime for N&V.   If no relief, she may contact office.

## 2020-06-28 NOTE — Patient Instructions (Signed)
Unisom and Vitamin B6 are good for Nausea in pregnancy.  You will take one of each at bedtime.

## 2020-07-05 NOTE — Progress Notes (Signed)
Patient was assessed and managed by nursing staff during this encounter. I have reviewed the chart and agree with the documentation and plan. I have also made any necessary editorial changes.  [place] Bing, MD 07/05/2020 11:55 AM

## 2020-07-14 ENCOUNTER — Ambulatory Visit (INDEPENDENT_AMBULATORY_CARE_PROVIDER_SITE_OTHER): Payer: Medicaid Other

## 2020-07-14 ENCOUNTER — Other Ambulatory Visit: Payer: Self-pay

## 2020-07-14 VITALS — BP 123/72 | HR 90 | Ht 70.0 in | Wt 135.4 lb

## 2020-07-14 DIAGNOSIS — Z3A09 9 weeks gestation of pregnancy: Secondary | ICD-10-CM | POA: Diagnosis not present

## 2020-07-14 DIAGNOSIS — O3680X0 Pregnancy with inconclusive fetal viability, not applicable or unspecified: Secondary | ICD-10-CM

## 2020-07-14 DIAGNOSIS — Z3481 Encounter for supervision of other normal pregnancy, first trimester: Secondary | ICD-10-CM

## 2020-07-14 DIAGNOSIS — Z3491 Encounter for supervision of normal pregnancy, unspecified, first trimester: Secondary | ICD-10-CM | POA: Insufficient documentation

## 2020-07-14 DIAGNOSIS — Z3493 Encounter for supervision of normal pregnancy, unspecified, third trimester: Secondary | ICD-10-CM | POA: Insufficient documentation

## 2020-07-14 DIAGNOSIS — Z3482 Encounter for supervision of other normal pregnancy, second trimester: Secondary | ICD-10-CM | POA: Insufficient documentation

## 2020-07-14 MED ORDER — BLOOD PRESSURE KIT DEVI: 1.0000 | 0 refills | Status: DC

## 2020-07-14 MED ORDER — PRENATAL MULTIVITAMIN CH: 1.0000 | ORAL_TABLET | Freq: Every day | ORAL | Status: DC

## 2020-07-14 NOTE — Progress Notes (Signed)
New OB Intake  I connected with  Vicki Harmon on 07/14/20 at 10:15 AM EDT by in person visit and verified that I am speaking with the correct person using two identifiers. Nurse is located at [facility] and pt is located at [place] office.  I discussed the limitations, risks, security and privacy concerns of performing an evaluation and management service by telephone and the availability of in person appointments. I also discussed with the patient that there may be a patient responsible charge related to this service. The patient expressed understanding and agreed to proceed.  I explained I am completing New OB Intake today. We discussed her EDD of 02/14/21 that is based on Dating u/s done on 07/14/20. Pt is G2/P1. I reviewed her allergies, medications, Medical/Surgical/OB history, and appropriate screenings. I informed her of [facility] services. Based on history, this is a/an uncomplicated pregnancy.  Patient Active Problem List   Diagnosis Date Noted  . Headache disorder 10/30/2017    Concerns addressed today  Delivery Plans:  Plans to deliver at [facility].   MyChart/Babyscripts MyChart access verified. I explained pt will have some visits in office and some virtually. Babyscripts instructions given. Account successfully created and app downloaded.  Blood Pressure Cuff Blood pressure cuff ordered for patient to pick-up from [employer]. Explained after first prenatal appt pt will check weekly and document in Babyscripts.  Anatomy US Explained first scheduled [place] will be around 19 weeks. Dating and Viability u/s completed today.  Labs Discussed Avelina Laine genetic screening with patient. Would like both Panorama and Horizon drawn at new OB visit. Routine prenatal labs needed.  Covid Vaccine Patient has had 1st dose of covid vaccine, but has not received 2nd dose due to pregnancy.   [facility] Referral Patient is interested in referral to [facility].   Pregnancy Navigator Informed patient a pregnancy  navigator will see her at her first ob visit with provider.   [place] appointments Childcare: Informed patient when she has Ultrasound appointments she will not be able to bring child/ children with her to Ultrasound appointments. Asked if childcare would be an issue.   First visit review I reviewed new OB appt with pt. I explained she will have a pelvic exam, ob bloodwork with genetic screening, and PAP smear. Explained pt will be seen by Coral Ceo at first visit; encounter routed to appropriate provider. If new patient offered monthly Zoom meeting  Hamilton Capri, RN 07/14/2020  10:19 AM

## 2020-07-20 ENCOUNTER — Encounter: Payer: Medicaid Other | Admitting: Obstetrics and Gynecology

## 2020-07-21 ENCOUNTER — Ambulatory Visit (INDEPENDENT_AMBULATORY_CARE_PROVIDER_SITE_OTHER): Payer: Medicaid Other | Admitting: Obstetrics

## 2020-07-21 ENCOUNTER — Encounter: Payer: Self-pay | Admitting: Obstetrics

## 2020-07-21 ENCOUNTER — Other Ambulatory Visit: Payer: Self-pay

## 2020-07-21 ENCOUNTER — Other Ambulatory Visit (HOSPITAL_COMMUNITY)
Admission: RE | Admit: 2020-07-21 | Discharge: 2020-07-21 | Disposition: A | Discharge status: Home / Self Care | Payer: Medicaid Other | Source: Ambulatory Visit | Attending: Obstetrics and Gynecology | Admitting: Obstetrics and Gynecology

## 2020-07-21 VITALS — BP 132/78 | HR 90 | Wt 139.0 lb

## 2020-07-21 DIAGNOSIS — Z3481 Encounter for supervision of other normal pregnancy, first trimester: Secondary | ICD-10-CM

## 2020-07-21 DIAGNOSIS — Z3480 Encounter for supervision of other normal pregnancy, unspecified trimester: Secondary | ICD-10-CM | POA: Diagnosis not present

## 2020-07-21 DIAGNOSIS — Z3A10 10 weeks gestation of pregnancy: Secondary | ICD-10-CM | POA: Diagnosis not present

## 2020-07-21 LAB — OB RESULTS CONSOLE GC/CHLAMYDIA: Gonorrhea: NEGATIVE

## 2020-07-21 LAB — HEPATITIS C ANTIBODY: HCV Ab: NEGATIVE

## 2020-07-21 LAB — OB RESULTS CONSOLE HIV ANTIBODY (ROUTINE TESTING): HIV: NONREACTIVE

## 2020-07-21 NOTE — Progress Notes (Signed)
Subjective:    Vicki Harmon is being seen today for her first obstetrical visit.  This is a planned pregnancy. She is at [redacted]w[redacted]d gestation. Her obstetrical history is significant for none. Relationship with FOB: significant other, not living together. Patient does intend to breast feed. Pregnancy history fully reviewed.  The information documented in the HPI was reviewed and verified.  Menstrual History: OB History    Gravida  2   Para  1   Term  1   Preterm      AB      Living  1     SAB      IAB      Ectopic      Multiple      Live Births  1            Patient's last menstrual period was 05/02/2020.    Past Medical History:  Diagnosis Date  . Chlamydia   . Medical history non-contributory     Past Surgical History:  Procedure Laterality Date  . NO PAST SURGERIES      (Not in a hospital admission)  Allergies  Allergen Reactions  . Shellfish Allergy Swelling    Social History   Tobacco Use  . Smoking status: Never Smoker  . Smokeless tobacco: Never Used  Substance Use Topics  . Alcohol use: Not Currently    Comment: not since confirmed pregnancy    Family History  Problem Relation Age of Onset  . Hyperlipidemia Mother   . Hypertension Mother      Review of Systems Constitutional: negative for weight loss Gastrointestinal: negative for vomiting Genitourinary:negative for genital lesions and vaginal discharge and dysuria Musculoskeletal:negative for back pain Behavioral/Psych: negative for abusive relationship, depression, illegal drug usage and tobacco use    Objective:    BP 132/78   Pulse 90   Wt 139 lb (63 kg)   LMP 05/02/2020   BMI 19.94 kg/m  General Appearance:    Alert, cooperative, no distress, appears stated age  Head:    Normocephalic, without obvious abnormality, atraumatic  Eyes:    PERRL, conjunctiva/corneas clear, EOM's intact, fundi    benign, both eyes  Ears:    Normal TM's and external ear canals, both ears  Nose:    Nares normal, septum midline, mucosa normal, no drainage    or sinus tenderness  Throat:   Lips, mucosa, and tongue normal; teeth and gums normal  Neck:   Supple, symmetrical, trachea midline, no adenopathy;    thyroid:  no enlargement/tenderness/nodules; no carotid   bruit or JVD  Back:     Symmetric, no curvature, ROM normal, no CVA tenderness  Lungs:     Clear to auscultation bilaterally, respirations unlabored  Chest Wall:    No tenderness or deformity   Heart:    Regular rate and rhythm, S1 and S2 normal, no murmur, rub   or gallop  Breast Exam:    No tenderness, masses, or nipple abnormality  Abdomen:     Soft, non-tender, bowel sounds active all four quadrants,    no masses, no organomegaly  Genitalia:    Normal female without lesion, discharge or tenderness  Extremities:   Extremities normal, atraumatic, no cyanosis or edema  Pulses:   2+ and symmetric all extremities  Skin:   Skin color, texture, turgor normal, no rashes or lesions  Lymph nodes:   Cervical, supraclavicular, and axillary nodes normal  Neurologic:   CNII-XII intact, normal strength, sensation and reflexes  throughout      Lab Review Urine pregnancy test Labs reviewed yes Radiologic studies reviewed no  Assessment:    Pregnancy at [redacted]w[redacted]d weeks    Plan:     1. Supervision of other normal pregnancy, antepartum Rx: - Cytology - PAP( [facility]) - Cervicovaginal ancillary only( [facility]) - Obstetric Panel, Including HIV - Hepatitis C antibody - Culture, OB Urine - Genetic Screening  Prenatal vitamins.  Counseling provided regarding continued use of seat belts, cessation of alcohol consumption, smoking or use of illicit drugs; infection precautions i.e., influenza/TDAP immunizations, toxoplasmosis,CMV, parvovirus, listeria and varicella; workplace safety, exercise during pregnancy; routine dental care, safe medications, sexual activity, hot tubs, saunas, pools, travel, caffeine use, fish and  methlymercury, potential toxins, hair treatments, varicose veins Weight gain recommendations per IOM guidelines reviewed: underweight/BMI< 18.5--> gain 28 - 40 lbs; normal weight/BMI 18.5 - 24.9--> gain 25 - 35 lbs; overweight/BMI 25 - 29.9--> gain 15 - 25 lbs; obese/BMI >30->gain  11 - 20 lbs Problem list reviewed and updated. FIRST/CF mutation testing/NIPT/QUAD SCREEN/fragile X/Ashkenazi Jewish population testing/Spinal muscular atrophy discussed: requested. Role of ultrasound in pregnancy discussed; fetal survey: requested. Amniocentesis discussed: not indicated.   Orders Placed This Encounter  Procedures  . Culture, OB Urine  . Obstetric Panel, Including HIV  . Hepatitis C antibody  . Genetic Screening    Follow up in 5 weeks.  I have spent a total of 20 minutes of face-to-face time, excluding clinical staff time, reviewing notes and preparing to see patient, ordering tests and/or medications, and counseling the patient.   Brock Bad, MD 07/21/2020 4:11 PM

## 2020-07-22 LAB — CERVICOVAGINAL ANCILLARY ONLY
Bacterial Vaginitis (gardnerella): NEGATIVE
Candida Glabrata: NEGATIVE
Candida Vaginitis: NEGATIVE
Chlamydia: NEGATIVE
Comment: NEGATIVE
Comment: NEGATIVE
Comment: NEGATIVE
Comment: NEGATIVE
Comment: NEGATIVE
Comment: NORMAL
Neisseria Gonorrhea: NEGATIVE
Trichomonas: NEGATIVE

## 2020-07-23 LAB — CULTURE, OB URINE

## 2020-07-23 LAB — URINE CULTURE, OB REFLEX

## 2020-07-25 ENCOUNTER — Other Ambulatory Visit: Payer: Medicaid Other

## 2020-07-26 LAB — OBSTETRIC PANEL, INCLUDING HIV
Antibody Screen: NEGATIVE
Basophils Absolute: 0 10*3/uL (ref 0.0–0.2)
Basos: 0 %
EOS (ABSOLUTE): 0.1 10*3/uL (ref 0.0–0.4)
Eos: 1 %
HIV Screen 4th Generation wRfx: NONREACTIVE
Hematocrit: 35 % (ref 34.0–46.6)
Hemoglobin: 11.7 g/dL (ref 11.1–15.9)
Hepatitis B Surface Ag: NEGATIVE
Immature Grans (Abs): 0.1 10*3/uL (ref 0.0–0.1)
Immature Granulocytes: 1 %
Lymphocytes Absolute: 3.2 10*3/uL — ABNORMAL HIGH (ref 0.7–3.1)
Lymphs: 34 %
MCH: 27.6 pg (ref 26.6–33.0)
MCHC: 33.4 g/dL (ref 31.5–35.7)
MCV: 83 fL (ref 79–97)
Monocytes Absolute: 0.6 10*3/uL (ref 0.1–0.9)
Monocytes: 7 %
Neutrophils Absolute: 5.6 10*3/uL (ref 1.4–7.0)
Neutrophils: 57 %
Platelets: 247 10*3/uL (ref 150–450)
RBC: 4.24 x10E6/uL (ref 3.77–5.28)
RDW: 13.2 % (ref 11.7–15.4)
RPR Ser Ql: NONREACTIVE
Rh Factor: POSITIVE
Rubella Antibodies, IGG: 11.7 index (ref >0.99)
WBC: 9.6 10*3/uL (ref 3.4–10.8)

## 2020-07-26 LAB — CYTOLOGY - PAP: Diagnosis: NEGATIVE

## 2020-07-26 LAB — HEPATITIS C ANTIBODY: Hep C Virus Ab: 0.4 s/co ratio (ref 0.0–0.9)

## 2020-07-27 ENCOUNTER — Encounter: Payer: Self-pay | Admitting: Obstetrics

## 2020-07-27 ENCOUNTER — Other Ambulatory Visit: Payer: Self-pay

## 2020-07-27 MED ORDER — PREPLUS 27-1 MG PO TABS: 1.0000 | ORAL_TABLET | Freq: Every day | ORAL | 13 refills | Status: DC

## 2020-07-27 NOTE — Telephone Encounter (Signed)
Refill on prenatals.

## 2020-08-25 ENCOUNTER — Ambulatory Visit (INDEPENDENT_AMBULATORY_CARE_PROVIDER_SITE_OTHER): Payer: Medicaid Other | Admitting: Obstetrics & Gynecology

## 2020-08-25 ENCOUNTER — Other Ambulatory Visit: Payer: Self-pay

## 2020-08-25 DIAGNOSIS — Z3481 Encounter for supervision of other normal pregnancy, first trimester: Secondary | ICD-10-CM

## 2020-08-25 DIAGNOSIS — Z315 Encounter for genetic counseling: Secondary | ICD-10-CM | POA: Diagnosis not present

## 2020-08-25 NOTE — Progress Notes (Signed)
   PRENATAL VISIT NOTE  Subjective:  Vicki Harmon is a 20 y.o. G2P1001 at [redacted]w[redacted]d being seen today for ongoing prenatal care.  She is currently monitored for the following issues for this low-risk pregnancy and has Headache disorder and Encounter for supervision of normal pregnancy in first trimester on their problem list.  Patient reports nausea.  Contractions: Not present. Vag. Bleeding: None.  Movement: Absent. Denies leaking of fluid.   The following portions of the patient's history were reviewed and updated as appropriate: allergies, current medications, past family history, past medical history, past social history, past surgical history and problem list.   Objective:   Vitals:   08/25/20 0932  BP: 114/74  Pulse: (!) 101  Weight: 139 lb (63 kg)    Fetal Status: Fetal Heart Rate (bpm): 152   Movement: Absent     General:  Alert, oriented and cooperative. Patient is in no acute distress.  Skin: Skin is warm and dry. No rash noted.   Cardiovascular: Normal heart rate noted  Respiratory: Normal respiratory effort, no problems with respiration noted  Abdomen: Soft, gravid, appropriate for gestational age.  Pain/Pressure: Absent     Pelvic: Cervical exam deferred        Extremities: Normal range of motion.  Edema: None  Mental Status: Normal mood and affect. Normal behavior. Normal judgment and thought content.   Assessment and Plan:  Pregnancy: G2P1001 at [redacted]w[redacted]d 1. Encounter for supervision of other normal pregnancy in first trimester Second trimester screening - AFP, Serum, Open Spina Bifida - Genetic Screening - US MFM OB COMP + 14 WK; Future  Preterm labor symptoms and general obstetric precautions including but not limited to vaginal bleeding, contractions, leaking of fluid and fetal movement were reviewed in detail with the patient. Please refer to After Visit Summary for other counseling recommendations.   Return in about 4 weeks (around 09/22/2020).  No future  appointments.  Scheryl Darter, MD

## 2020-08-25 NOTE — Patient Instructions (Signed)
Second Trimester of Pregnancy °The second trimester of pregnancy is from week 13 through week 27. This is also called months 4 through 6 of pregnancy. This is often the time when you feel your best. °During the second trimester: °Morning sickness is less or has stopped. °You may have more energy. °You may feel hungry more often. °At this time, your unborn baby (fetus) is growing very fast. At the end of the sixth month, the unborn baby may be up to 12 inches long and weigh about 1½ pounds. You will likely start to feel the baby move between 16 and 20 weeks of pregnancy. °Body changes during your second trimester °Your body continues to go through many changes during this time. The changes vary and generally return to normal after the baby is born. °Physical changes °You will gain more weight. °You may start to get stretch marks on your hips, belly (abdomen), and breasts. °Your breasts will grow and may hurt. °Dark spots or blotches may develop on your face. °A dark line from your belly button to the pubic area (linea nigra) may appear. °You may have changes in your hair. °Health changes °You may have headaches. °You may have heartburn. °You may have trouble pooping (constipation). °You may have hemorrhoids or swollen, bulging veins (varicose veins). °Your gums may bleed. °You may pee (urinate) more often. °You may have back pain. °Follow these instructions at home: °Medicines °Take over-the-counter and prescription medicines only as told by your doctor. Some medicines are not safe during pregnancy. °Take a prenatal vitamin that contains at least 600 micrograms (mcg) of folic acid. °Eating and drinking °Eat healthy meals that include: °Fresh fruits and vegetables. °Whole grains. °Good sources of protein, such as meat, eggs, or tofu. °Low-fat dairy products. °Avoid raw meat and unpasteurized juice, milk, and cheese. °You may need to take these actions to prevent or treat trouble pooping: °Drink enough fluids to keep  your pee (urine) pale yellow. °Eat foods that are high in fiber. These include beans, whole grains, and fresh fruits and vegetables. °Limit foods that are high in fat and sugar. These include fried or sweet foods. °Activity °Exercise only as told by your doctor. Most people can do their usual exercise during pregnancy. Try to exercise for 30 minutes at least 5 days a week. °Stop exercising if you have pain or cramps in your belly or lower back. °Do not exercise if it is too hot or too humid, or if you are in a place of great height (high altitude). °Avoid heavy lifting. °If you choose to, you may have sex unless your doctor tells you not to. °Relieving pain and discomfort °Wear a good support bra if your breasts are sore. °Take warm water baths (sitz baths) to soothe pain or discomfort caused by hemorrhoids. Use hemorrhoid cream if your doctor approves. °Rest with your legs raised (elevated) if you have leg cramps or low back pain. °If you develop bulging veins in your legs: °Wear support hose as told by your doctor. °Raise your feet for 15 minutes, 3-4 times a day. °Limit salt in your food. °Safety °Wear your seat belt at all times when you are in a car. °Talk with your doctor if someone is hurting you or yelling at you a lot. °Lifestyle °Do not use hot tubs, steam rooms, or saunas. °Do not douche. Do not use tampons or scented sanitary pads. °Avoid cat litter boxes and soil used by cats. These carry germs that can harm your baby and   can cause a loss of your baby by miscarriage or stillbirth. °Do not use herbal medicines, illegal drugs, or medicines that are not approved by your doctor. Do not drink alcohol. °Do not smoke or use any products that contain nicotine or tobacco. If you need help quitting, ask your doctor. °General instructions °Keep all follow-up visits. This is important. °Ask your doctor about local prenatal classes. °Ask your doctor about the right foods to eat or for help finding a  counselor. °Where to find more information °American Pregnancy Association: americanpregnancy.org °American College of Obstetricians and Gynecologists: www.acog.org °Office on Women's Health: womenshealth.gov/pregnancy °Contact a doctor if: °You have a headache that does not go away when you take medicine. °You have changes in how you see, or you see spots in front of your eyes. °You have mild cramps, pressure, or pain in your lower belly. °You continue to feel like you may vomit (nauseous), you vomit, or you have watery poop (diarrhea). °You have bad-smelling fluid coming from your vagina. °You have pain when you pee or your pee smells bad. °You have very bad swelling of your face, hands, ankles, feet, or legs. °You have a fever. °Get help right away if: °You are leaking fluid from your vagina. °You have spotting or bleeding from your vagina. °You have very bad belly cramping or pain. °You have trouble breathing. °You have chest pain. °You faint. °You have not felt your baby move for the time period told by your doctor. °You have new or increased pain, swelling, or redness in an arm or leg. °Summary °The second trimester of pregnancy is from week 13 through week 27 (months 4 through 6). °Eat healthy meals. °Exercise as told by your doctor. Most people can do their usual exercise during pregnancy. °Do not use herbal medicines, illegal drugs, or medicines that are not approved by your doctor. Do not drink alcohol. °Call your doctor if you get sick or if you notice anything unusual about your pregnancy. °This information is not intended to replace advice given to you by your health care provider. Make sure you discuss any questions you have with your health care provider. °Document Revised: 08/05/2019 Document Reviewed: 06/11/2019 °Elsevier Patient Education © 2022 [employer] ° °

## 2020-08-25 NOTE — Progress Notes (Signed)
ROB 101w2d  AFP Due today.  CC: Vomiting w/ PNV allergic to shellfish pt advise to try PNV at night.

## 2020-08-27 LAB — AFP, SERUM, OPEN SPINA BIFIDA
AFP MoM: 1.18
AFP Value: 41.5 ng/mL
Gest. Age on Collection Date: 15 weeks
Maternal Age At EDD: 22.5 yr
OSBR Risk 1 IN: 10000
Test Results:: NEGATIVE
Weight: 139 [lb_av]

## 2020-09-01 ENCOUNTER — Encounter: Payer: Self-pay | Admitting: Obstetrics & Gynecology

## 2020-09-05 ENCOUNTER — Encounter: Payer: Self-pay | Admitting: Obstetrics & Gynecology

## 2020-09-19 ENCOUNTER — Encounter (HOSPITAL_COMMUNITY): Payer: Self-pay

## 2020-09-19 ENCOUNTER — Other Ambulatory Visit: Payer: Self-pay

## 2020-09-19 ENCOUNTER — Ambulatory Visit (HOSPITAL_COMMUNITY)
Admission: EM | Admit: 2020-09-19 | Discharge: 2020-09-19 | Disposition: A | Discharge status: Home / Self Care | Payer: Medicaid Other | Attending: Student | Admitting: Student

## 2020-09-19 DIAGNOSIS — U071 COVID-19: Secondary | ICD-10-CM | POA: Diagnosis not present

## 2020-09-19 DIAGNOSIS — Z3A18 18 weeks gestation of pregnancy: Secondary | ICD-10-CM | POA: Diagnosis not present

## 2020-09-19 DIAGNOSIS — O98512 Other viral diseases complicating pregnancy, second trimester: Secondary | ICD-10-CM | POA: Diagnosis not present

## 2020-09-19 DIAGNOSIS — Z1152 Encounter for screening for COVID-19: Secondary | ICD-10-CM | POA: Diagnosis not present

## 2020-09-19 DIAGNOSIS — O99512 Diseases of the respiratory system complicating pregnancy, second trimester: Secondary | ICD-10-CM

## 2020-09-19 DIAGNOSIS — J069 Acute upper respiratory infection, unspecified: Secondary | ICD-10-CM

## 2020-09-19 DIAGNOSIS — O26892 Other specified pregnancy related conditions, second trimester: Secondary | ICD-10-CM | POA: Diagnosis present

## 2020-09-19 MED ORDER — PREDNISONE 20 MG PO TABS: 20.0000 mg | ORAL_TABLET | Freq: Every day | ORAL | 0 refills | Status: AC

## 2020-09-19 NOTE — ED Provider Notes (Signed)
[facility]    CSN: [id number] Arrival date & time: 09/19/20  1233      History   Chief Complaint Chief Complaint  Patient presents with   Hemoptysis   Emesis   Back Pain    HPI Vicki Harmon is a 20 y.o. female presenting with 3 days of viral symptoms.  She is also in her second trimester of pregnancy.  Medical history noncontributory.  Notes 3 days of hacking cough productive of yellow sputum.  She had 1 episode of coughing so severe this morning that she noticed bright red blood in her sputum, and she actually threw up as well.  States she did not see any blood in the vomit, and she denies absolutely any abdominal pain, cramping, vaginal bleeding, vaginal spotting.  She endorses some low back pain for months, unchanged.  States she always has morning sickness, this is unchanged.  She only threw up once today when she was coughing.  Denies nausea.  Denies diarrhea.  Denies fever/chills.  Denies shortness of breath, chest pain, dizziness, weakness. She has a follow-up appointment with her OB in 1 day. Denies history pulmonary disease.   HPI  Past Medical History:  Diagnosis Date   Chlamydia    Medical history non-contributory     Patient Active Problem List   Diagnosis Date Noted   Encounter for supervision of normal pregnancy in first trimester 07/14/2020   Headache disorder 10/30/2017    Past Surgical History:  Procedure Laterality Date   NO PAST SURGERIES      OB History     Gravida  2   Para  1   Term  1   Preterm      AB      Living  1      SAB      IAB      Ectopic      Multiple      Live Births  1            Home Medications    Prior to Admission medications   Medication Sig Start Date End Date Taking? Authorizing Provider  doxylamine, Sleep, (UNISOM) 25 MG tablet Take 25 mg by mouth at bedtime as needed.   Yes [provider]  predniSONE (DELTASONE) 20 MG tablet Take 1 tablet (20 mg total) by mouth  daily for 5 days. 09/19/20 09/24/20 Yes Hazel Sams, PA-C  UNABLE TO FIND Med Name: pt states taking otc womens multivitamin instead of prenatal vitamin   Yes [provider]  Blood Pressure Monitoring (BLOOD PRESSURE KIT) DEVI 1 kit by Does not apply route once a week. 07/14/20   Woodroe Mode, MD  Prenatal Vit-Fe Fumarate-FA (PREPLUS) 27-1 MG TABS Take 1 tablet by mouth daily. 07/27/20   Shelly Bombard, MD  pyridOXINE (VITAMIN B-6) 50 MG tablet Take 50 mg by mouth daily.    [provider]    Family History Family History  Problem Relation Age of Onset   Hyperlipidemia Mother    Hypertension Mother     Social History Social History   Tobacco Use   Smoking status: Never   Smokeless tobacco: Never  Vaping Use   Vaping Use: Never used  Substance Use Topics   Alcohol use: Not Currently    Comment: not since confirmed pregnancy   Drug use: Not Currently    Types: Marijuana    Comment: last used 2 days ago     Allergies  Shellfish allergy   Review of Systems Review of Systems  Constitutional:  Negative for appetite change, chills and fever.  HENT:  Positive for congestion. Negative for ear pain, rhinorrhea, sinus pressure, sinus pain and sore throat.   Eyes:  Negative for redness and visual disturbance.  Respiratory:  Positive for cough. Negative for chest tightness, shortness of breath and wheezing.   Cardiovascular:  Negative for chest pain and palpitations.  Gastrointestinal:  Negative for abdominal pain, constipation, diarrhea, nausea and vomiting.  Genitourinary:  Negative for dysuria, frequency and urgency.  Musculoskeletal:  Negative for myalgias.  Neurological:  Negative for dizziness, weakness and headaches.  Psychiatric/Behavioral:  Negative for confusion.   All other systems reviewed and are negative.   Physical Exam Triage Vital Signs ED Triage Vitals  Enc Vitals Group     BP 09/19/20 1331 (S) (!) 103/50     Pulse Rate 09/19/20  1331 87     Resp 09/19/20 1331 18     Temp 09/19/20 1331 98.2 F (36.8 C)     Temp src --      SpO2 09/19/20 1331 98 %     Weight --      Height --      Head Circumference --      Peak Flow --      Pain Score 09/19/20 1329 0     Pain Loc --      Pain Edu? --      Excl. in [place]? --    No data found.  Updated Vital Signs BP (S) (!) 103/50   Pulse 87   Temp 98.2 F (36.8 C)   Resp 18   LMP 05/02/2020   SpO2 98%   Visual Acuity Right Eye Distance:   Left Eye Distance:   Bilateral Distance:    Right Eye Near:   Left Eye Near:    Bilateral Near:     Physical Exam Vitals reviewed.  Constitutional:      General: She is not in acute distress.    Appearance: Normal appearance. She is not ill-appearing.  HENT:     Head: Normocephalic and atraumatic.     Right Ear: Hearing, tympanic membrane, ear canal and external ear normal. No swelling or tenderness. There is no impacted cerumen. No mastoid tenderness. Tympanic membrane is not perforated, erythematous, retracted or bulging.     Left Ear: Hearing, tympanic membrane, ear canal and external ear normal. No swelling or tenderness. There is no impacted cerumen. No mastoid tenderness. Tympanic membrane is not perforated, erythematous, retracted or bulging.     Nose:     Right Sinus: No maxillary sinus tenderness or frontal sinus tenderness.     Left Sinus: No maxillary sinus tenderness or frontal sinus tenderness.     Mouth/Throat:     Mouth: Mucous membranes are moist.     Pharynx: Uvula midline. No oropharyngeal exudate or posterior oropharyngeal erythema.     Tonsils: No tonsillar exudate.  Cardiovascular:     Rate and Rhythm: Normal rate and regular rhythm.     Heart sounds: Normal heart sounds.  Pulmonary:     Breath sounds: Normal breath sounds and air entry. No wheezing, rhonchi or rales.  Chest:     Chest wall: No tenderness.  Abdominal:     General: Abdomen is flat. Bowel sounds are normal.     Palpations: Abdomen  is soft.     Tenderness: There is no abdominal tenderness. There is no right CVA tenderness,  left CVA tenderness, guarding or rebound.     Comments: Pregnant NO abdominal pain No CVAT  Lymphadenopathy:     Cervical: No cervical adenopathy.  Neurological:     General: No focal deficit present.     Mental Status: She is alert and oriented to person, place, and time.  Psychiatric:        Attention and Perception: Attention and perception normal.        Mood and Affect: Mood and affect normal.        Behavior: Behavior normal. Behavior is cooperative.        Thought Content: Thought content normal.        Judgment: Judgment normal.     UC Treatments / Results  Labs (all labs ordered are listed, but only abnormal results are displayed) Labs Reviewed  SARS CORONAVIRUS 2 (TAT 6-24 HRS)    EKG   Radiology No results found.  Procedures Procedures (including critical care time)  Medications Ordered in UC Medications - No data to display  Initial Impression / Assessment and Plan / UC Course  I have reviewed the triage vital signs and the nursing notes.  Pertinent labs & imaging results that were available during my care of the patient were reviewed by me and considered in my medical decision making (see chart for details).     This patient is a very pleasant 20 y.o. year old female presenting with viral URI with cough. She is in her 2nd trimester of pregnancy. Afebrile, nontachy, no abd pain. Reassuring exam. Low dose of prednisone as below. No history pulmonary disease. F/u with OB as scheduled in 2 days on 7/13. ED return precautions discussed. Patient verbalizes understanding and agreement.    Final Clinical Impressions(s) / UC Diagnoses   Final diagnoses:  Viral URI with cough  Diseases of the respiratory system complicating pregnancy, second trimester  Encounter for screening for COVID-19     Discharge Instructions      -Prednisone one pill daily x5 days, taken  in the morning with food. I recommend taking this in the morning as it could give you energy.  Avoid NSAIDs like ibuprofen and alleve while taking this medication as they can increase your risk of stomach upset and even GI bleeding when in combination with a steroid. You can continue tylenol (acetaminophen) up to 1034m 3x daily. -Follow-up with your OB as scheduled 7/13 -Any abdominal pain, vaginal bleeding, dizziness, weakness- head to ED or call 911    ED Prescriptions     Medication Sig Dispense Auth. Provider   predniSONE (DELTASONE) 20 MG tablet Take 1 tablet (20 mg total) by mouth daily for 5 days. 5 tablet GHazel Sams PA-C      PDMP not reviewed this encounter.   GHazel Sams PA-C 09/19/20 1512

## 2020-09-19 NOTE — Discharge Instructions (Addendum)
-  Prednisone one pill daily x5 days, taken in the morning with food. I recommend taking this in the morning as it could give you energy.  Avoid NSAIDs like ibuprofen and alleve while taking this medication as they can increase your risk of stomach upset and even GI bleeding when in combination with a steroid. You can continue tylenol (acetaminophen) up to 1000mg  3x daily. -Follow-up with your OB as scheduled 7/13 -Any abdominal pain, vaginal bleeding, dizziness, weakness- head to ED or call 911

## 2020-09-19 NOTE — ED Triage Notes (Addendum)
Pt c/o cold symptoms. States is [redacted] weeks pregnant and has been taking robitussin.  States has been coughing up blood and throwing up, states wants to be checked and to make sure baby is ok. Denies abdominal cramping or vaginal bleeding. Endorses some low back pain. Pt reports that she has been unable to tolerate her prenatal vitamin (induces vomiting) so has stopped taking it in favor of womens gummy multivitamin.

## 2020-09-20 LAB — SARS CORONAVIRUS 2 (TAT 6-24 HRS): SARS Coronavirus 2: POSITIVE — AB

## 2020-09-21 ENCOUNTER — Ambulatory Visit: Payer: Medicaid Other

## 2020-09-22 ENCOUNTER — Telehealth (INDEPENDENT_AMBULATORY_CARE_PROVIDER_SITE_OTHER): Payer: Medicaid Other | Admitting: Obstetrics

## 2020-09-22 ENCOUNTER — Encounter: Payer: Self-pay | Admitting: Obstetrics

## 2020-09-22 DIAGNOSIS — Z3480 Encounter for supervision of other normal pregnancy, unspecified trimester: Secondary | ICD-10-CM

## 2020-09-22 DIAGNOSIS — O98512 Other viral diseases complicating pregnancy, second trimester: Secondary | ICD-10-CM

## 2020-09-22 DIAGNOSIS — U071 COVID-19: Secondary | ICD-10-CM

## 2020-09-22 DIAGNOSIS — Z3A19 19 weeks gestation of pregnancy: Secondary | ICD-10-CM

## 2020-09-22 DIAGNOSIS — O219 Vomiting of pregnancy, unspecified: Secondary | ICD-10-CM

## 2020-09-22 MED ORDER — VITAFOL GUMMIES 3.33-0.333-34.8 MG PO CHEW: 3.0000 | CHEWABLE_TABLET | Freq: Every day | ORAL | 11 refills | Status: DC

## 2020-09-22 NOTE — Addendum Note (Signed)
Addended by: Coral Ceo A on: 09/22/2020 04:14 PM   Modules accepted: Orders

## 2020-09-22 NOTE — Progress Notes (Addendum)
   OBSTETRICS PRENATAL VIRTUAL VISIT ENCOUNTER NOTE  [facility]   Patient location: Home  I connected with Vicki Harmon on 09/22/20 at  9:45 AM EDT by MyChart Video Encounter and verified that I am speaking with the correct person using two identifiers. I discussed the limitations, risks, security and privacy concerns of performing an evaluation and management service virtually and the availability of in person appointments. I also discussed with the patient that there may be a patient responsible charge related to this service. The patient expressed understanding and agreed to proceed. Subjective:  Vicki Harmon is a 20 y.o. G2P1001 at [redacted]w[redacted]d being seen today for ongoing prenatal care.  She is currently monitored for the following issues for this low-risk pregnancy and has Headache disorder and Encounter for supervision of normal pregnancy in first trimester on their problem list.  Patient reports  cough and congestion .   Recently diagnosed with Covid and is in isolation and feeling better with less coughing and no fever.  .  .   . Denies any leaking of fluid.   The following portions of the patient's history were reviewed and updated as appropriate: allergies, current medications, past family history, past medical history, past social history, past surgical history and problem list.   Objective:  There were no vitals filed for this visit.  Fetal Status:           General:  Alert, oriented and cooperative. Patient is in no acute distress.  Respiratory: Normal respiratory effort, no problems with respiration noted  Mental Status: Normal mood and affect. Normal behavior. Normal judgment and thought content.  Rest of physical exam deferred due to type of encounter  Imaging: No results found.  Assessment and Plan:  Pregnancy: G2P1001 at [redacted]w[redacted]d  1. Supervision of other normal pregnancy, antepartum Rx: - Prenatal Vit-Fe Phos-FA-Omega  (VITAFOL GUMMIES) 3.33-0.333-34.8 MG CHEW; Chew 3 tablets by mouth daily before breakfast.  Dispense: 90 tablet; Refill: 11  2. Nausea and vomiting in pregnancy  3. COVID-19 virus infection - recovering in isolation - pregnancy precautions given      Preterm labor symptoms and general obstetric precautions including but not limited to vaginal bleeding, contractions, leaking of fluid and fetal movement were reviewed in detail with the patient. I discussed the assessment and treatment plan with the patient. The patient was provided an opportunity to ask questions and all were answered. The patient agreed with the plan and demonstrated an understanding of the instructions. The patient was advised to call back or seek an in-person office evaluation/go to MAU at [facility] for any urgent or concerning symptoms. Please refer to After Visit Summary for other counseling recommendations.   I have spent a total of 15 minutes of non-face-to-face time, excluding clinical staff time, reviewing notes and preparing to see patient, ordering tests and/or medications, and counseling the patient.   Return in about 1 week (around 09/29/2020) for ROB.  Future Appointments  Date Time Provider Department Center  09/27/2020  7:45 AM [facility] US[facility]    Coral Ceo, MD [facility]  09/22/20

## 2020-09-27 ENCOUNTER — Ambulatory Visit: Payer: Medicaid Other | Attending: Obstetrics & Gynecology

## 2020-09-27 ENCOUNTER — Other Ambulatory Visit: Payer: Self-pay

## 2020-09-27 ENCOUNTER — Other Ambulatory Visit: Payer: Self-pay | Admitting: Obstetrics & Gynecology

## 2020-09-27 DIAGNOSIS — Z3481 Encounter for supervision of other normal pregnancy, first trimester: Secondary | ICD-10-CM | POA: Diagnosis not present

## 2020-11-09 ENCOUNTER — Other Ambulatory Visit: Payer: Medicaid Other

## 2020-11-09 ENCOUNTER — Other Ambulatory Visit: Payer: Self-pay

## 2020-11-09 ENCOUNTER — Ambulatory Visit (INDEPENDENT_AMBULATORY_CARE_PROVIDER_SITE_OTHER): Payer: Medicaid Other | Admitting: Obstetrics and Gynecology

## 2020-11-09 DIAGNOSIS — Z3A26 26 weeks gestation of pregnancy: Secondary | ICD-10-CM

## 2020-11-09 DIAGNOSIS — Z23 Encounter for immunization: Secondary | ICD-10-CM

## 2020-11-09 DIAGNOSIS — Z3482 Encounter for supervision of other normal pregnancy, second trimester: Secondary | ICD-10-CM

## 2020-11-09 NOTE — Progress Notes (Signed)
   PRENATAL VISIT NOTE  Subjective:  Vicki Harmon is a 20 y.o. G2P1001 at [redacted]w[redacted]d being seen today for ongoing prenatal care.  She is currently monitored for the following issues for this low-risk pregnancy and has Headache disorder; Encounter for supervision of other normal pregnancy, second trimester; and [redacted] weeks gestation of pregnancy on their problem list.  Patient doing well with no acute concerns today. She reports backache.  Contractions: Not present. Vag. Bleeding: None.  Movement: Present. Denies leaking of fluid.   The following portions of the patient's history were reviewed and updated as appropriate: allergies, current medications, past family history, past medical history, past social history, past surgical history and problem list. Problem list updated.  Objective:   Vitals:   11/09/20 0839  BP: 129/75  Pulse: 87  Weight: 166 lb 11.2 oz (75.6 kg)    Fetal Status: Fetal Heart Rate (bpm): 144 Fundal Height: 27 cm Movement: Present     General:  Alert, oriented and cooperative. Patient is in no acute distress.  Skin: Skin is warm and dry. No rash noted.   Cardiovascular: Normal heart rate noted  Respiratory: Normal respiratory effort, no problems with respiration noted  Abdomen: Soft, gravid, appropriate for gestational age.  Pain/Pressure: Absent     Pelvic: Cervical exam deferred        Extremities: Normal range of motion.  Edema: None  Mental Status:  Normal mood and affect. Normal behavior. Normal judgment and thought content.   Assessment and Plan:  Pregnancy: G2P1001 at [redacted]w[redacted]d  1. Encounter for supervision of other normal pregnancy, second trimester Routine exam - Glucose Tolerance, 2 Hours w/1 Hour - RPR - HIV antibody (with reflex) - CBC  2. [redacted] weeks gestation of pregnancy   Preterm labor symptoms and general obstetric precautions including but not limited to vaginal bleeding, contractions, leaking of fluid and fetal movement were reviewed in detail with  the patient.  Please refer to After Visit Summary for other counseling recommendations.   Return in about 2 weeks (around 11/23/2020) for ROB, in person.   Mariel Aloe, MD Faculty Attending [facility]

## 2020-11-09 NOTE — Addendum Note (Signed)
Addended by: Natale Milch D on: 11/09/2020 09:49 AM   Modules accepted: Orders

## 2020-11-09 NOTE — Progress Notes (Signed)
Pt reports fetal movement, denies pain.  

## 2020-11-10 LAB — CBC
Hematocrit: 30.4 % — ABNORMAL LOW (ref 34.0–46.6)
Hemoglobin: 9.6 g/dL — ABNORMAL LOW (ref 11.1–15.9)
MCH: 27.3 pg (ref 26.6–33.0)
MCHC: 31.6 g/dL (ref 31.5–35.7)
MCV: 86 fL (ref 79–97)
Platelets: 250 10*3/uL (ref 150–450)
RBC: 3.52 x10E6/uL — ABNORMAL LOW (ref 3.77–5.28)
RDW: 13.3 % (ref 11.7–15.4)
WBC: 11.9 10*3/uL — ABNORMAL HIGH (ref 3.4–10.8)

## 2020-11-10 LAB — RPR: RPR Ser Ql: NONREACTIVE

## 2020-11-10 LAB — HIV ANTIBODY (ROUTINE TESTING W REFLEX): HIV Screen 4th Generation wRfx: NONREACTIVE

## 2020-11-10 LAB — GLUCOSE TOLERANCE, 2 HOURS W/ 1HR
Glucose, 1 hour: 96 mg/dL (ref 65–179)
Glucose, 2 hour: 95 mg/dL (ref 65–152)
Glucose, Fasting: 82 mg/dL (ref 65–91)

## 2020-11-23 ENCOUNTER — Other Ambulatory Visit: Payer: Self-pay

## 2020-11-23 ENCOUNTER — Encounter: Payer: Self-pay | Admitting: Women's Health

## 2020-11-23 ENCOUNTER — Ambulatory Visit (INDEPENDENT_AMBULATORY_CARE_PROVIDER_SITE_OTHER): Payer: Medicaid Other | Admitting: Women's Health

## 2020-11-23 VITALS — BP 117/65 | HR 80 | Wt 167.0 lb

## 2020-11-23 DIAGNOSIS — Z23 Encounter for immunization: Secondary | ICD-10-CM | POA: Diagnosis not present

## 2020-11-23 DIAGNOSIS — D563 Thalassemia minor: Secondary | ICD-10-CM | POA: Insufficient documentation

## 2020-11-23 DIAGNOSIS — Z3A28 28 weeks gestation of pregnancy: Secondary | ICD-10-CM

## 2020-11-23 DIAGNOSIS — Z3483 Encounter for supervision of other normal pregnancy, third trimester: Secondary | ICD-10-CM

## 2020-11-23 NOTE — Patient Instructions (Addendum)
Maternity Assessment Unit (MAU)  The Maternity Assessment Unit (MAU) is located at the [facility] and [place] at [facility]. The address is: [address] [place], [place], [place] [address]. Please see map below for additional directions.    The Maternity Assessment Unit is designed to help you during your pregnancy, and for up to 6 weeks after delivery, with any pregnancy- or postpartum-related emergencies, if you think you are in labor, or if your water has broken. For example, if you experience nausea and vomiting, vaginal bleeding, severe abdominal or pelvic pain, elevated blood pressure or other problems related to your pregnancy or postpartum time, please come to the Maternity Assessment Unit for assistance.       Third Trimester of Pregnancy The third trimester of pregnancy is from week 28 through week 35. This is months 7 through 9. The third trimester is a time when the unborn baby (fetus) is growing rapidly. At the end of the ninth month, the fetus is about 20 inches long and weighs 6-10 pounds. Body changes during your third trimester During the third trimester, your body will continue to go through many changes. The changes vary and generally return to normal after your baby is born. Physical changes Your weight will continue to increase. You can expect to gain 25-35 pounds (11-16 kg) by the end of the pregnancy if you begin pregnancy at a normal weight. If you are underweight, you can expect to gain 28-40 lb (about 13-18 kg), and if you are overweight, you can expect to gain 15-25 lb (about 7-11 kg). You may begin to get stretch marks on your hips, abdomen, and breasts. Your breasts will continue to grow and may hurt. A yellow fluid (colostrum) may leak from your breasts. This is the first milk you are producing for your baby. You may have changes in your hair. These can include thickening of your hair, rapid growth, and changes in texture. Some people also  have hair loss during or after pregnancy, or hair that feels dry or thin. Your belly button may stick out. You may notice more swelling in your hands, face, or ankles. Health changes You may have heartburn. You may have constipation. You may develop hemorrhoids. You may develop swollen, bulging veins (varicose veins) in your legs. You may have increased body aches in the pelvis, back, or thighs. This is due to weight gain and increased hormones that are relaxing your joints. You may have increased tingling or numbness in your hands, arms, and legs. The skin on your abdomen may also feel numb. You may feel short of breath because of your expanding uterus. Other changes You may urinate more often because the fetus is moving lower into your pelvis and pressing on your bladder. You may have more problems sleeping. This may be caused by the size of your abdomen, an increased need to urinate, and an increase in your body's metabolism. You may notice the fetus "dropping," or moving lower in your abdomen (lightening). You may have increased vaginal discharge. You may notice that you have pain around your pelvic bone as your uterus distends. Follow these instructions at home: Medicines Follow your health care provider's instructions regarding medicine use. Specific medicines may be either safe or unsafe to take during pregnancy. Do not take any medicines unless approved by your health care provider. Take a prenatal vitamin that contains at least 600 micrograms (mcg) of folic acid. Eating and drinking Eat a healthy diet that includes fresh fruits  and vegetables, whole grains, good sources of protein such as meat, eggs, or tofu, and low-fat dairy products. Avoid raw meat and unpasteurized juice, milk, and cheese. These carry germs that can harm you and your baby. Eat 4 or 5 small meals rather than 3 large meals a day. You may need to take these actions to prevent or treat constipation: Drink enough  fluid to keep your urine pale yellow. Eat foods that are high in fiber, such as beans, whole grains, and fresh fruits and vegetables. Limit foods that are high in fat and processed sugars, such as fried or sweet foods. Activity Exercise only as directed by your health care provider. Most people can continue their usual exercise routine during pregnancy. Try to exercise for 30 minutes at least 5 days a week. Stop exercising if you experience contractions in the uterus. Stop exercising if you develop pain or cramping in the lower abdomen or lower back. Avoid heavy lifting. Do not exercise if it is very hot or humid or if you are at a high altitude. If you choose to, you may continue to have sex unless your health care provider tells you not to. Relieving pain and discomfort Take frequent breaks and rest with your legs raised (elevated) if you have leg cramps or low back pain. Take warm sitz baths to soothe any pain or discomfort caused by hemorrhoids. Use hemorrhoid cream if your health care provider approves. Wear a supportive bra to prevent discomfort from breast tenderness. If you develop varicose veins: Wear support hose as told by your health care provider. Elevate your feet for 15 minutes, 3-4 times a day. Limit salt in your diet. Safety Talk to your health care provider before traveling far distances. Do not use hot tubs, steam rooms, or saunas. Wear your seat belt at all times when driving or riding in a car. Talk with your health care provider if someone is verbally or physically abusive to you. Preparing for birth To prepare for the arrival of your baby: Take prenatal classes to understand, practice, and ask questions about labor and delivery. Visit the hospital and tour the maternity area. Purchase a rear-facing car seat and make sure you know how to install it in your car. Prepare the baby's room or sleeping area. Make sure to remove all pillows and stuffed animals from the  baby's crib to prevent suffocation. General instructions Avoid cat litter boxes and soil used by cats. These carry germs that can cause birth defects in the baby. If you have a cat, ask someone to clean the litter box for you. Do not douche or use tampons. Do not use scented sanitary pads. Do not use any products that contain nicotine or tobacco, such as cigarettes, e-cigarettes, and chewing tobacco. If you need help quitting, ask your health care provider. Do not use any herbal remedies, illegal drugs, or medicines that were not prescribed to you. Chemicals in these products can harm your baby. Do not drink alcohol. You will have more frequent prenatal exams during the third trimester. During a routine prenatal visit, your health care provider will do a physical exam, perform tests, and discuss your overall health. Keep all follow-up visits. This is important. Where to find more information American Pregnancy Association: americanpregnancy.[place] and Gynecologists: PoolDevices.com.pt Office on [employer] Health: KeywordPortfolios.com.br Contact a health care provider if you have: A fever. Mild pelvic cramps, pelvic pressure, or nagging pain in your abdominal area or lower back. Vomiting or diarrhea. Bad-smelling  vaginal discharge or foul-smelling urine. Pain when you urinate. A headache that does not go away when you take medicine. Visual changes or see spots in front of your eyes. Get help right away if: Your water breaks. You have regular contractions less than 5 minutes apart. You have spotting or bleeding from your vagina. You have severe abdominal pain. You have difficulty breathing. You have chest pain. You have fainting spells. You have not felt your baby move for the time period told by your health care provider. You have new or increased pain, swelling, or redness in an arm or leg. Summary The third trimester of pregnancy is  from week 28 through week 40 (months 7 through 9). You may have more problems sleeping. This can be caused by the size of your abdomen, an increased need to urinate, and an increase in your body's metabolism. You will have more frequent prenatal exams during the third trimester. Keep all follow-up visits. This is important. This information is not intended to replace advice given to you by your health care provider. Make sure you discuss any questions you have with your health care provider. Document Revised: 08/05/2019 Document Reviewed: 06/11/2019 Elsevier Patient Education  2022 Elsevier Inc.       Preterm Labor The normal length of a pregnancy is 39-41 weeks. Preterm labor is when labor starts before 37 completed weeks of pregnancy. Babies who are born prematurely and survive may not be fully developed and may be at an increased risk for long-term problems such as cerebral palsy, developmental delays, and vision and hearing problems. Babies who are born too early may have problems soon after birth. Premature babies may have problems regulating blood sugar, body temperature, heart rate, and breathing rate. These babies often have trouble with feeding. The risk of having problems is highest for babies who are born before 34 weeks of pregnancy. What are the causes? The exact cause of this condition is not known. What increases the risk? You are more likely to have preterm labor if you have certain risk factors that relate to your medical history, problems with present and past pregnancies, and lifestyle factors. Medical history You have abnormalities of the uterus, including a short cervix. You have STIs (sexually transmitted infections) or other infections of the urinary tract and the vagina. You have chronic illnesses, such as blood clotting problems, diabetes, or high blood pressure. You are overweight or underweight. Present and past pregnancies You have had preterm labor before. You  are pregnant with twins or other multiples. You have been diagnosed with a condition in which the placenta covers your cervix (placenta previa). You waited less than 18 months between giving birth and becoming pregnant again. Your unborn baby has some abnormalities. You have vaginal bleeding during pregnancy. You became pregnant through in vitro fertilization (IVF). Lifestyle and environmental factors You use tobacco products or drink alcohol. You use drugs. You have stress and no social support. You experience domestic violence. You are exposed to certain chemicals or environmental pollutants. Other factors You are younger than age 68 or older than age 67. What are the signs or symptoms? Symptoms of this condition include: Cramps similar to those that can happen during a menstrual period. The cramps may happen with diarrhea. Pain in the abdomen or lower back. Regular contractions that may feel like tightening of the abdomen. A feeling of increased pressure in the pelvis. Increased watery or bloody mucus discharge from the vagina. Water breaking (ruptured amniotic sac). How is this  diagnosed? This condition is diagnosed based on: Your medical history and a physical exam. A pelvic exam. An ultrasound. Monitoring your uterus for contractions. Other tests, including: A swab of the cervix to check for a chemical called fetal fibronectin. Urine tests. How is this treated? Treatment for this condition depends on the length of your pregnancy, your condition, and the health of your baby. Treatment may include: Taking medicines, such as: Hormone medicines. These may be given early in pregnancy to help support the pregnancy. Medicines to stop contractions. Medicines to help mature the baby's lungs. These may be prescribed if the risk of delivery is high. Medicines to help protect your baby from brain and nerve complications such as cerebral palsy. Bed rest. If the labor happens before 34  weeks of pregnancy, you may need to stay in the hospital. Delivery of the baby. Follow these instructions at home:  Do not use any products that contain nicotine or tobacco. These products include cigarettes, chewing tobacco, and vaping devices, such as e-cigarettes. If you need help quitting, ask your health care provider. Do not drink alcohol. Take over-the-counter and prescription medicines only as told by your health care provider. Rest as told by your health care provider. Return to your normal activities as told by your health care provider. Ask your health care provider what activities are safe for you. Keep all follow-up visits. This is important. How is this prevented? To increase your chance of having a full-term pregnancy: Do not use drugs or take medicines that have not been prescribed to you during your pregnancy. Talk with your health care provider before taking any herbal supplements, even if you have been taking them regularly. Make sure you gain a healthy amount of weight during your pregnancy. Watch for infection. If you think that you might have an infection, get it checked right away. Symptoms of infection may include: Fever. Abnormal vaginal discharge or discharge that smells bad. Pain or burning with urination. Needing to urinate urgently. Frequently urinating or passing small amounts of urine frequently. Blood in your urine or urine that smells bad or unusual. Where to find more information U.S. Department of Health and Cytogeneticist on Women's Health: http://hoffman.com/ [employer] of Obstetricians and Gynecologists: www.acog.org Centers for Disease Control and Prevention, Preterm Birth: FootballExhibition.com.br Contact a health care provider if: You think you are going into preterm labor. You have signs or symptoms of preterm labor. You have symptoms of infection. Get help right away if: You are having regular, painful contractions every 5 minutes or  less. Your water breaks. Summary Preterm labor is labor that starts before you reach 37 weeks of pregnancy. Delivering your baby early increases your baby's risk of developing long-term problems. You are more likely to have preterm labor if you have certain risk factors that relate to your medical history, problems with present and past pregnancies, and lifestyle factors. Keep all follow-up visits. This is important. Contact a health care provider if you have signs or symptoms of preterm labor. This information is not intended to replace advice given to you by your health care provider. Make sure you discuss any questions you have with your health care provider. Document Revised: 03/01/2020 Document Reviewed: 03/01/2020 Elsevier Patient Education  2022 Elsevier Inc.       Pregnancy and Influenza Influenza, also called the flu, is an infection of the lungs and airways (respiratory tract). If you are pregnant, you are more likely to catch the flu. You are also more  likely to have serious illness from the flu. This is because pregnancy causes changes to your body's disease-fighting system (immune system), heart, and lungs. If you develop serious illness from the flu, this can cause problems for you and your developing baby. How do people get the flu? The flu is caused by a type of germ called a virus. It spreads when virus particles get passed from person to person by: Being near a sick person who is coughing or sneezing. Touching something that has the virus on it and then touching your mouth, nose, or face. The influenza virus is most common during the fall and winter. What actions can I take to protect myself against the flu?  Get a flu shot. The best way to prevent the flu is to get a flu shot before flu season starts. The flu shot is not dangerous for your developing baby. It may even help protect your baby from the flu for up to 6 months after birth. Wash your hands often with soap and  warm water for at least 20 seconds. If soap and water are not available, use alcohol-based hand sanitizer. Do not come in close contact with sick people. Do not share food, drinks, or utensils with other people. Avoid touching your eyes, nose, and mouth. Clean frequently used surfaces at home, school, or work. Practice healthy lifestyle habits, such as: Eating a healthy, balanced diet. Drinking plenty of fluids. Exercising regularly or as told by your health care provider. Sleeping 7-9 hours each night. Finding ways to manage stress. What should I do if I have flu symptoms?  If you have any symptoms of the flu, even after getting a flu shot, contact your health care provider right away. To reduce fever, take over-the-counter acetaminophen as told by your health care provider. If you have the flu, your health care provider may give you antiviral medicine to keep the flu from becoming severe and to shorten how long it lasts. Avoid spreading the flu to others: Stay home until you are well. Cover your nose and mouth when you cough or sneeze. Wash your hands often. Follow these instructions at home: Take over-the-counter and prescription medicines only as told by your health care provider. Do not take any medicine, including cold or flu medicine, unless your health care provider tells you to do so. If you were prescribed antiviral medicine, take it as told by your health care provider. Do not stop taking the antiviral medicine even if you start to feel better. Eat a nutrient-rich diet that includes fresh fruits and vegetables, whole grains, lean protein, and low-fat dairy. Drink enough fluid to keep your urine clear or pale yellow. Get plenty of rest. Keep all follow-up visits. This is important. Contact a health care provider if: You have a fever or chills. You have a cough, sore throat, or stuffy nose. You have worsening or unusual muscle aches, headache, tiredness, or loss of  appetite. You have vomiting or diarrhea. Get help right away if: You have trouble breathing. You have chest pain. You have abdominal pain. You begin to have labor pains. You do not feel your baby move. You have diarrhea or vomiting that will not go away. You have dizziness or confusion. Your symptoms do not improve, even with treatment. These symptoms may represent a serious problem that is an emergency. Do not wait to see if the symptoms will go away. Get medical help right away. Call your local emergency services (911 in the [place] Do not  drive yourself to the hospital. Summary If you are pregnant, you are more likely to catch the flu. You are also more likely to have a more serious case of the flu. If you have flu-like symptoms, call your health care provider right away. If you develop serious illness from the flu, this can cause problems for you and your developing baby. The best way to prevent the flu is to get a flu shot before flu season starts. The flu shot is safe during pregnancy and not dangerous for your developing baby. If you have the flu and were prescribed antiviral medicine, take it as told by your health care provider. This information is not intended to replace advice given to you by your health care provider. Make sure you discuss any questions you have with your health care provider. Document Revised: 10/17/2019 Document Reviewed: 10/17/2019 Elsevier Patient Education  2022 Elsevier Inc.       Childbirth Education Options: Operating Room Services Department Classes:  Childbirth education classes can help you get ready for a positive parenting experience. You can also meet other expectant parents and get free stuff for your baby. Each class runs for five weeks on the same night and costs $45 for the mother-to-be and her support person. Medicaid covers the cost if you are eligible. Call [phone] to register. [facility] Childbirth  Education: Classes can vary in availability and schedule is subject to change. For most up-to-date information please visit [email or web address] to review and register.

## 2020-11-23 NOTE — Progress Notes (Signed)
Flu vaccine given without difficulty L Deltoid

## 2020-11-23 NOTE — Progress Notes (Signed)
Subjective:  Vicki Harmon is a 20 y.o. G2P1001 at [redacted]w[redacted]d being seen today for ongoing prenatal care.  She is currently monitored for the following issues for this low-risk pregnancy and has Headache disorder; Encounter for supervision of other normal pregnancy, second trimester; and Alpha thalassemia silent carrier on their problem list.  Patient reports no complaints.  Contractions: Not present. Vag. Bleeding: None.  Movement: Present. Denies leaking of fluid.   The following portions of the patient's history were reviewed and updated as appropriate: allergies, current medications, past family history, past medical history, past social history, past surgical history and problem list. Problem list updated.  Objective:   Vitals:   11/23/20 1018  BP: 117/65  Pulse: 80  Weight: 167 lb (75.8 kg)    Fetal Status: Fetal Heart Rate (bpm): 140 Fundal Height: 28 cm Movement: Present     General:  Alert, oriented and cooperative. Patient is in no acute distress.  Skin: Skin is warm and dry. No rash noted.   Cardiovascular: Normal heart rate noted  Respiratory: Normal respiratory effort, no problems with respiration noted  Abdomen: Soft, gravid, appropriate for gestational age. Pain/Pressure: Absent     Pelvic: Vag. Bleeding: None     Cervical exam deferred        Extremities: Normal range of motion.  Edema: None  Mental Status: Normal mood and affect. Normal behavior. Normal judgment and thought content.   Urinalysis:      Assessment and Plan:  Pregnancy: G2P1001 at [redacted]w[redacted]d  1. Encounter for supervision of other normal pregnancy, second trimester -CBE info given  PHQ9 SCORE ONLY 11/09/2020 07/14/2020  PHQ-9 Total Score 2 4   GAD 7 : Generalized Anxiety Score 07/14/2020  Nervous, Anxious, on Edge 0  Control/stop worrying 0  Worry too much - different things 1  Trouble relaxing 1  Restless 0  Easily annoyed or irritable 2  Afraid - awful might happen 0  Total GAD 7 Score 4   2. [redacted] weeks  gestation of pregnancy  Preterm labor symptoms and general obstetric precautions including but not limited to vaginal bleeding, contractions, leaking of fluid and fetal movement were reviewed in detail with the patient. I discussed the assessment and treatment plan with the patient. The patient was provided an opportunity to ask questions and all were answered. The patient agreed with the plan and demonstrated an understanding of the instructions. The patient was advised to call back or seek an in-person office evaluation/go to [facility] at [facility] for any urgent or concerning symptoms. Please refer to After Visit Summary for other counseling recommendations.  Return in about 2 weeks (around 12/07/2020) for in-person LOB/APP OK.   Jamy Cleckler, Odie Sera, NP

## 2020-12-07 ENCOUNTER — Ambulatory Visit (INDEPENDENT_AMBULATORY_CARE_PROVIDER_SITE_OTHER): Payer: Medicaid Other | Admitting: Obstetrics

## 2020-12-07 ENCOUNTER — Other Ambulatory Visit: Payer: Self-pay

## 2020-12-07 ENCOUNTER — Encounter: Payer: Self-pay | Admitting: Obstetrics

## 2020-12-07 VITALS — BP 134/77 | HR 90 | Wt 171.0 lb

## 2020-12-07 DIAGNOSIS — O99013 Anemia complicating pregnancy, third trimester: Secondary | ICD-10-CM

## 2020-12-07 MED ORDER — IRON POLYSACCH CMPLX-B12-FA 150-0.025-1 MG PO CAPS: 1.0000 | ORAL_CAPSULE | ORAL | 5 refills | Status: DC

## 2020-12-07 NOTE — Progress Notes (Signed)
Subjective:  Vicki Harmon is a 20 y.o. G2P1001 at [redacted]w[redacted]d being seen today for ongoing prenatal care.  She is currently monitored for the following issues for this low-risk pregnancy and has Headache disorder; Encounter for supervision of other normal pregnancy, second trimester; and Alpha thalassemia silent carrier on their problem list.  Patient reports no complaints.  Contractions: Not present. Vag. Bleeding: None.  Movement: Present. Denies leaking of fluid.   The following portions of the patient's history were reviewed and updated as appropriate: allergies, current medications, past family history, past medical history, past social history, past surgical history and problem list. Problem list updated.  Objective:   Vitals:   12/07/20 1048  BP: 134/77  Pulse: 90  Weight: 171 lb (77.6 kg)    Fetal Status:     Movement: Present     General:  Alert, oriented and cooperative. Patient is in no acute distress.  Skin: Skin is warm and dry. No rash noted.   Cardiovascular: Normal heart rate noted  Respiratory: Normal respiratory effort, no problems with respiration noted  Abdomen: Soft, gravid, appropriate for gestational age. Pain/Pressure: Absent      Pelvic:  Cervical exam deferred        Extremities: Normal range of motion.     Mental Status: Normal mood and affect. Normal behavior. Normal judgment and thought content.   Urinalysis:      Assessment and Plan:  Pregnancy: G2P1001 at [redacted]w[redacted]d  There are no diagnoses linked to this encounter. Preterm labor symptoms and general obstetric precautions including but not limited to vaginal bleeding, contractions, leaking of fluid and fetal movement were reviewed in detail with the patient. Please refer to After Visit Summary for other counseling recommendations.   Return in about 2 weeks (around 12/21/2020) for ROB.   Brock Bad, MD  12/07/20

## 2020-12-21 ENCOUNTER — Encounter: Payer: Medicaid Other | Admitting: Obstetrics and Gynecology

## 2020-12-23 ENCOUNTER — Encounter: Payer: Self-pay | Admitting: Nurse Practitioner

## 2020-12-23 ENCOUNTER — Other Ambulatory Visit: Payer: Self-pay

## 2020-12-23 ENCOUNTER — Ambulatory Visit (INDEPENDENT_AMBULATORY_CARE_PROVIDER_SITE_OTHER): Payer: Medicaid Other | Admitting: Nurse Practitioner

## 2020-12-23 VITALS — BP 127/72 | HR 97 | Wt 172.0 lb

## 2020-12-23 DIAGNOSIS — R519 Headache, unspecified: Secondary | ICD-10-CM

## 2020-12-23 DIAGNOSIS — Z3482 Encounter for supervision of other normal pregnancy, second trimester: Secondary | ICD-10-CM

## 2020-12-23 DIAGNOSIS — D563 Thalassemia minor: Secondary | ICD-10-CM

## 2020-12-23 DIAGNOSIS — O219 Vomiting of pregnancy, unspecified: Secondary | ICD-10-CM

## 2020-12-23 DIAGNOSIS — Z3A32 32 weeks gestation of pregnancy: Secondary | ICD-10-CM

## 2020-12-23 NOTE — Patient Instructions (Signed)
° ° °BRAINSTORMING ° °Develop a Plan °Goals: °Provide a way to start conversation about your new life with a baby °Assist parents in recognizing and using resources within their reach °Help pave the way before birth for an easier period of transition afterwards. ° °Make a list of the following information to keep in a central location: °Full name of Mom and Partner: _____________________________________________ °Baby's full name and Date of Birth: ___________________________________________ °Home Address: ___________________________________________________________ °________________________________________________________________________ °Home Phone: ____________________________________________________________ °Parents' cell numbers: _____________________________________________________ °________________________________________________________________________ °Name and contact info for OB: ______________________________________________ °Name and contact info for Pediatrician:________________________________________ °Contact info for Lactation Consultants: ________________________________________ ° °REST and SLEEP °*You each need at least 4-5 hours of uninterrupted sleep every day. Write specific names and contact information.* °How are you going to rest in the postpartum period? While partner's home? When partner returns to work? When you both return to work? °Where will your baby sleep? °Who is available to help during the day? Evening? Night? °Who could move in for a period to help support you? °What are some ideas to help you get enough sleep? °__________________________________________________________________________________________________________________________________________________________________________________________________________________________________________ °NUTRITIOUS FOOD AND DRINK °*Plan for meals before your baby is born so you can have healthy food to eat during the immediate postpartum  period.* °Who will look after breakfast? Lunch? Dinner? List names and contact information. Brainstorm quick, healthy ideas for each meal. °What can you do before baby is born to prepare meals for the postpartum period? °How can others help you with meals? °Which grocery stores provide online shopping and delivery? °Which restaurants offer take-out or delivery options? °______________________________________________________________________________________________________________________________________________________________________________________________________________________________________________________________________________________________________________________________________________________________________________________________________ ° °CARE FOR MOM °*It's important that mom is cared for and pampered in the postpartum period. Remember, the most important ways new mothers need care are: sleep, nutrition, gentle exercise, and time off.* °Who can come take care of mom during this period? Make a list of people with their contact information. °List some activities that make you feel cared for, rested, and energized? Who can make sure you have opportunities to do these things? °Does mom have a space of her very own within your home that's just for her? Make a “Mama Cave” where she can be comfortable, rest, and renew herself daily. °______________________________________________________________________________________________________________________________________________________________________________________________________________________________________________________________________________________________________________________________________________________________________________________________________ ° ° ° °CARE FOR AND FEEDING BABY °*Knowledgeable and encouraging people will offer the best support with regard to feeding your baby.* °Educate yourself and choose the best feeding option  for your baby. °Make a list of people who will guide, support, and be a resource for you as your care for and feed your baby. (Friends that have breastfed or are currently breastfeeding, lactation consultants, breastfeeding support groups, etc.) °Consider a postpartum doula. (These websites can give you information: dona.org & padanc.org) °Seek out local breastfeeding resources like the breastfeeding support group at [facility] or [employer]. °______________________________________________________________________________________________________________________________________________________________________________________________________________________________________________________________________________________________________________________________________________________________________________________________________ ° °CHORES AND ERRANDS °Who can help with a thorough cleaning before baby is born? °Make a list of people who will help with housekeeping and chores, like laundry, light cleaning, dishes, bathrooms, etc. °Who can run some errands for you? °What can you do to make sure you are stocked with basic supplies before baby is born? °Who is going to do the shopping? °______________________________________________________________________________________________________________________________________________________________________________________________________________________________________________________________________________________________________________________________________________________________________________________________________ ° ° ° ° °Family Adjustment °*Nurture yourselves…it helps parents be more loving and allows for better bonding with their child.* °What sorts of things do you and partner enjoy doing together? Which activities help you to connect and strengthen your relationship? Make a list of those things. Make a list of people whom you   trust to care for your baby so you  can have some time together as a couple. °What types of things help partner feel connected to Mom? Make a list. °What needs will partner have in order to bond with baby? °Other children? Who will care for them when you go into labor and while you are in the hospital? °Think about what the needs of your older children might be. Who can help you meet those needs? In what ways are you helping them prepare for bringing baby home? List some specific strategies you have for family adjustment. °_______________________________________________________________________________________________________________________________________________________________________________________________________________________________________________________________________________________________________________________________________________ ° °SUPPORT °*Someone who can empathize with experiences normalizes your problems and makes them more bearable.* °Make a list of other friends, neighbors, and/or co-workers you know with infants (and small children, if applicable) with whom you can connect. °Make a list of local or online support groups, mom groups, etc. in which you can be involved. °______________________________________________________________________________________________________________________________________________________________________________________________________________________________________________________________________________________________________________________________________________________________________________________________________ ° °Childcare Plans °Investigate and plan for childcare if mom is returning to work. °Talk about mom's concerns about her transition back to work. °Talk about partner's concerns regarding this transition. ° °Mental Health °*Your mental health is one of the highest priorities for a pregnant or postpartum mom.* °1 in 5 women experience anxiety and/or depression from the time  of conception through the first year after birth. °Postpartum Mood Disorders are the #1 complication of pregnancy and childbirth and the suffering experienced by these mothers is not necessary! These illnesses are temporary and respond well to treatment, which often includes self-care, social support, talk therapy, and medication when needed. °Women experiencing anxiety and depression often say things like: “I'm supposed to be happy…why do I feel so sad?”, “Why can't I snap out of it?”, “I'm having thoughts that scare me.” °There is no need to be embarrassed if you are feeling these symptoms: °Overwhelmed, anxious, angry, sad, guilty, irritable, hopeless, exhausted but can't sleep °You are NOT alone. You are NOT to blame. With help, you WILL be well. °Where can I find help? Medical professionals such as your OB, midwife, gynecologist, family practitioner, primary care provider, pediatrician, or mental health providers; [facility] support groups: Feelings After Birth, Breastfeeding Support Group, Baby and Me Group, and Fit 4 Two exercise classes. °You have permission to ask for help. It will confirm your feelings, validate your experiences, share/learn coping strategies, and gain support and encouragement as you heal. You are important! °BRAINSTORM °Make a list of local resources, including resources for mom and for partner. °Identify support groups. °Identify people to call late at night - include names and contact info. °Talk with partner about perinatal mood and anxiety disorders. °Talk with your OB, midwife, and doula about baby blues and about perinatal mood and anxiety disorders. °Talk with your pediatrician about perinatal mood and anxiety disorders. ° ° °Support & Sanity Savers   °What do you really need? ° °Basics °In preparing for a new baby, many expectant parents spend hours shopping for baby clothes, decorating the nursery, and deciding which car seat to buy. Yet most don't think much about what  the reality of parenting a newborn will be like, and what they need to make it through that. So, here is the advice of experienced parents. We know you'll read this, and think “they're exaggerating, I don't really need that.” Just trust us on these, OK? Plan for all of this, and if it turns out you don't need it, come back and teach us how you did it! ° °Must-Haves (Once baby's survival   needs are met, make sure you attend to your own survival needs!) °Sleep °An average newborn sleeps 16-18 hours per day, over 6-7 sleep periods, rarely more than three hours at a time. It is normal and healthy for a newborn to wake throughout the night... but really hard on parents!! °Naps. Prioritize sleep above any responsibilities like: cleaning house, visiting friends, running errands, etc.  Sleep whenever baby sleeps. If you can't nap, at least have restful times when baby eats. The more rest you get, the more patient you will be, the more emotionally stable, and better at solving problems. ° °Food °You may not have realized it would be difficult to eat when you have a newborn. Yet, when we talk to °countless new parents, they say things like “it may be 2:00 pm when I realize I haven't had breakfast yet.” Or “every time we sit down to dinner, baby needs to eat, and my food gets cold, so I don't bother to eat it.” °Finger food. Before your baby is born, stock up with one months' worth of food that: 1) you can eat with one hand while holding a baby, 2) doesn't need to be prepped, 3) is good hot or cold, 4) doesn't spoil when left out for a few hours, and 5) you like to eat. Think about: nuts, dried fruit, Clif bars, pretzels, jerky, gogurt, baby carrots, apples, bananas, crackers, cheez-n-crackers, string cheese, hot pockets or frozen burritos to microwave, garden burgers and breakfast pastries to put in the toaster, yogurt drinks, etc. °Restaurant Menus. Make lists of your favorite restaurants & menu items. When family/friends  want to help, you can give specific information without much thought. They can either bring you the food or send gift cards for just the right meals. °Freezer Meals.  Take some time to make a few meals to put in the freezer ahead of time.  Easy to freeze meals can be anything such as soup, lasagna, chicken pie, or spaghetti sauce. °Set up a Meal Schedule.  Ask friends and family to sign up to bring you meals during the first few weeks of being home. (It can be passed around at baby showers!) You have no idea how helpful this will be until you are in the throes of parenting.  www.takethemameal.com is a great website to check out. °Emotional Support °Know who to call when you're stressed out. Parenting a newborn is very challenging work. There are times when it totally overwhelms your normal coping abilities. EVERY NEW PARENT NEEDS TO HAVE A PLAN FOR WHO TO CALL WHEN THEY JUST CAN'T COPE ANY MORE. (And it has to be someone other than the baby's other parent!) Before your baby is born, come up with at least one person you can call for support - write their phone number down and post it on the refrigerator. °Anxiety & Sadness. Baby blues are normal after pregnancy; however, there are more severe types of anxiety & sadness which can occur and should not be ignored.  They are always treatable, but you have to take the first step by reaching out for help. [facility] offers a “Mom Talk” group which meets every Tuesday from 10 am - 11 am.  This group is for new moms who need support and connection after their babies are born.  Call [phone].  °Really, Really Helpful (Plan for them! Make sure these happen often!!) °Physical Support with Taking Care of Yourselves °Asking friends and family. Before your baby is born, set up a schedule   of people who can come and visit and help out (or ask a friend to schedule for you). Any time someone says “let me know what I can do to help,” sign them up for a day. When they get  there, their job is not to take care of the baby (that's your job and your joy). Their job is to take care of you!  °Postpartum doulas. If you don't have anyone you can call on for support, look into postpartum doulas:  professionals at helping parents with caring for baby, caring for themselves, getting breastfeeding started, and helping with household tasks. www.padanc.org is a helpful website for learning about doulas in our area. °Peer Support / Parent Groups °Why: One of the greatest ideas for new parents is to be around other new parents. Parent groups give you a chance to share and listen to others who are going through the same season of life, get a sense of what is normal infant development by watching several babies learn and grow, share your stories of triumph and struggles with empathetic ears, and forgive your own mistakes when you realize all parents are learning by trial and error. °Where to find: There are many places you can meet other new parents throughout our community.  [facility] offers the following classes for new moms and their little ones:  Baby and Me (Birth to Crawling) and Breastfeeding Support Group. Go to www.conehealthybaby.com or call [phone] for more information. °Time for your Relationship °It's easy to get so caught up in meeting baby's immediate needs that it's hard to find time to connect with your partner, and meet the needs of your relationship. It's also easy to forget what “quality time with your partner” actually looks like. If you take your baby on a date, you'd be amazed how much of your couple time is spent feeding the baby, diapering the baby, admiring the baby, and talking about the baby. °Dating: Try to take time for just the two of you. Babysitter tip: Sometimes when moms are breastfeeding a newborn, they find it hard to figure out how to schedule outings around baby's unpredictable feeding schedules. Have the babysitter come for a three hour period. When  she comes over, if baby has just eaten, you can leave right away, and come back in two hours. If baby hasn't fed recently, you start the date at home. Once baby gets hungry and gets a good feeding in, you can head out for the rest of your date time. °Date Nights at Home: If you can't get out, at least set aside one evening a week to prioritize your relationship: whenever baby dozes off or doesn't have any immediate needs, spend a little time focusing on each other. °Potential conflicts: The main relationship conflicts that come up for new parents are: issues related to sexuality, financial stresses, a feeling of an unfair division of household tasks, and conflicts in parenting styles. The more you can work on these issues before baby arrives, the better!  °Fun and Frills (Don't forget these… and don't feel guilty for indulging in them!) °Everyone has something in life that is a fun little treat that they do just for themselves. It may be: reading the morning paper, or going for a daily jog, or having coffee with a friend once a week, or going to a movie on Friday nights, or fine chocolates, or bubble baths, or curling up with a good book. °Unless you do fun things for yourself every now and then,   it's hard to have the energy for fun with your baby. Whatever your "special" treats are, make sure you find a way to continue to indulge in them after your baby is born. These special moments can recharge you, and allow you to return to baby with a new joy   PERINATAL MOOD DISORDERS: [place]   Emergency and Crisis Resources:  If you are an imminent risk to self or others, are experiencing intense personal distress, and/or have noticed significant changes in activities of daily living, call:  [facility]: [place]: [place]: [phone] Or visit the following crisis centers: Local  Emergency Departments [facility]: [address], [place]. Hours: 8:30AM-5PM. Insurance Accepted: Medicaid, Medicare, and Uninsured.  [facility]  [address] [place] Mon-Friday 8am-3pm  [phone]                                                                                    Non-Crisis Resources: To identify specific providers that are covered by your insurance, contact your insurance company or local agencies: [place]: [phone] [facility] and [place]: [phone] [place]: [phone] [employer]- [place] [phone] [phone]                                                      Outpatient therapy and medication management providers:  [facility] [phone] Hours: 9AM-5PM  Insurance Accepted: Alben Spittle, Lorella Nimrod, Freddrick March, [place], Medicare  [facility] ([place]) [phone] Hours: 8AM-5PM  nsurance Accepted: All insurances EXCEPT AARP, [place], [place], and [place]: [phone]             Hours: 8AM-8PM Insurance Accepted: Cristal Ford, Freddrick March, [place], Medicare, [phone] [facility]: [phone] Hours: 8:30AM-7PM Insurance Accepted: Cristal Ford, Medicaid, Medicare, Tricare, [employer] Counseling:  [place] Accepted:  Holland Falling, Lorella Nimrod, [facility], [place], [facility] [phone] Hours: 9AM-5:30PM Insurance Accepted: Alben Spittle, Charlotte Crumb, and Medicaid, Medicare, [employer] [facility]:  [phone] Hours: 9am-5pm Insurance Accepted: BCBS; they do not accept Medicaid/Medicare [place]: [phone] Hours: 9am-9pm Insurance Accepted: All major insurance including Medicaid  and Medicare [facility]: [phone] Hours: 9AM-5:30PM Insurance Accepted: All insurances EXCEPT Medicaid and Medicare. [facility]: [place]: [place]:  [phone]  [place] (support for children in the NICU and/or with special needs), [place] [employer]: [phone]                                                                                     Online Resources: Postpartum Support International: http://jones-berg.com/  800-944-4PPD 2Moms Supporting Moms:  www.momssupportingmoms.net

## 2020-12-23 NOTE — Progress Notes (Signed)
    Subjective:  Vicki Harmon is a 20 y.o. G2P1001 at [redacted]w[redacted]d being seen today for ongoing prenatal care.  She is currently monitored for the following issues for this low-risk pregnancy and has Headache disorder; Encounter for supervision of other normal pregnancy, second trimester; and Alpha thalassemia silent carrier on their problem list.  Patient reports no complaints.  Contractions: Not present. Vag. Bleeding: None.  Movement: Present. Denies leaking of fluid.   The following portions of the patient's history were reviewed and updated as appropriate: allergies, current medications, past family history, past medical history, past social history, past surgical history and problem list. Problem list updated.  Objective:   Vitals:   12/23/20 1110  BP: 127/72  Pulse: 97  Weight: 172 lb (78 kg)    Fetal Status: Fetal Heart Rate (bpm): 142 Fundal Height: 30 cm Movement: Present     General:  Alert, oriented and cooperative. Patient is in no acute distress.  Skin: Skin is warm and dry. No rash noted.   Cardiovascular: Normal heart rate noted  Respiratory: Normal respiratory effort, no problems with respiration noted  Abdomen: Soft, gravid, appropriate for gestational age. Pain/Pressure: Absent     Pelvic:  Cervical exam deferred        Extremities: Normal range of motion.  Edema: None  Mental Status: Normal mood and affect. Normal behavior. Normal judgment and thought content.   Urinalysis:      Assessment and Plan:  Pregnancy: G2P1001 at [redacted]w[redacted]d  1. Encounter for supervision of other normal pregnancy, second trimester Taking iron supplement every other day Has some low back pain and is doing stretching exercises at home Plans on possible epidural Reviewed where to find info on tours at [facility] Postpartum info in AVS  2. Alpha thalassemia silent carrier Discussed with patient Not planning to get partner tested  3. Nausea and vomiting in pregnancy About every other day, has  vomiting in the morning and then is fine the rest of the day  4. Headache disorder Not having headaches now  5. [redacted] weeks gestation of pregnancy   Preterm labor symptoms and general obstetric precautions including but not limited to vaginal bleeding, contractions, leaking of fluid and fetal movement were reviewed in detail with the patient. Please refer to After Visit Summary for other counseling recommendations.  Return in about 2 weeks (around 01/06/2021) for in person ROB.  Nolene Bernheim, RN, MSN, NP-BC Nurse Practitioner, [facility] for [employer], [facility] 12/23/2020 11:42 AM

## 2020-12-23 NOTE — Progress Notes (Signed)
ROB 32 wks 3d  CC: None

## 2021-01-06 ENCOUNTER — Other Ambulatory Visit: Payer: Self-pay

## 2021-01-06 ENCOUNTER — Encounter: Payer: Self-pay | Admitting: Obstetrics

## 2021-01-06 ENCOUNTER — Ambulatory Visit (INDEPENDENT_AMBULATORY_CARE_PROVIDER_SITE_OTHER): Payer: Medicaid Other | Admitting: Obstetrics

## 2021-01-06 VITALS — BP 123/72 | HR 84 | Wt 180.0 lb

## 2021-01-06 DIAGNOSIS — Z3482 Encounter for supervision of other normal pregnancy, second trimester: Secondary | ICD-10-CM

## 2021-01-06 NOTE — Progress Notes (Signed)
Subjective:  Vicki Harmon is a 20 y.o. G2P1001 at [redacted]w[redacted]d being seen today for ongoing prenatal care.  She is currently monitored for the following issues for this low-risk pregnancy and has Headache disorder; Encounter for supervision of other normal pregnancy, second trimester; and Alpha thalassemia silent carrier on their problem list.  Patient reports no complaints.  Contractions: Irritability. Vag. Bleeding: None.  Movement: Present. Denies leaking of fluid.   The following portions of the patient's history were reviewed and updated as appropriate: allergies, current medications, past family history, past medical history, past social history, past surgical history and problem list. Problem list updated.  Objective:   Vitals:   01/06/21 0956  BP: 123/72  Pulse: 84  Weight: 180 lb (81.6 kg)    Fetal Status: Fetal Heart Rate (bpm): 141   Movement: Present     General:  Alert, oriented and cooperative. Patient is in no acute distress.  Skin: Skin is warm and dry. No rash noted.   Cardiovascular: Normal heart rate noted  Respiratory: Normal respiratory effort, no problems with respiration noted  Abdomen: Soft, gravid, appropriate for gestational age. Pain/Pressure: Present     Pelvic:  Cervical exam deferred        Extremities: Normal range of motion.  Edema: None  Mental Status: Normal mood and affect. Normal behavior. Normal judgment and thought content.   Urinalysis:      Assessment and Plan:  Pregnancy: G2P1001 at [redacted]w[redacted]d  1. Encounter for supervision of other normal pregnancy, second trimester   Preterm labor symptoms and general obstetric precautions including but not limited to vaginal bleeding, contractions, leaking of fluid and fetal movement were reviewed in detail with the patient. Please refer to After Visit Summary for other counseling recommendations.   Return in about 2 weeks (around 01/20/2021) for ROB.   Brock Bad, MD  01/06/21

## 2021-01-20 ENCOUNTER — Other Ambulatory Visit: Payer: Self-pay

## 2021-01-20 ENCOUNTER — Ambulatory Visit (INDEPENDENT_AMBULATORY_CARE_PROVIDER_SITE_OTHER): Payer: Medicaid Other | Admitting: Obstetrics and Gynecology

## 2021-01-20 ENCOUNTER — Other Ambulatory Visit (HOSPITAL_COMMUNITY)
Admission: RE | Admit: 2021-01-20 | Discharge: 2021-01-20 | Disposition: A | Discharge status: Home / Self Care | Payer: Medicaid Other | Source: Ambulatory Visit | Attending: Obstetrics and Gynecology | Admitting: Obstetrics and Gynecology

## 2021-01-20 VITALS — BP 124/77 | HR 91 | Wt 184.0 lb

## 2021-01-20 DIAGNOSIS — Z3483 Encounter for supervision of other normal pregnancy, third trimester: Secondary | ICD-10-CM

## 2021-01-20 DIAGNOSIS — D563 Thalassemia minor: Secondary | ICD-10-CM

## 2021-01-20 DIAGNOSIS — Z3A36 36 weeks gestation of pregnancy: Secondary | ICD-10-CM | POA: Insufficient documentation

## 2021-01-20 LAB — OB RESULTS CONSOLE GC/CHLAMYDIA: Gonorrhea: NEGATIVE

## 2021-01-20 NOTE — Progress Notes (Signed)
   PRENATAL VISIT NOTE  Subjective:  Vicki Harmon is a 20 y.o. G2P1001 at [redacted]w[redacted]d being seen today for ongoing prenatal care.  She is currently monitored for the following issues for this low-risk pregnancy and has Headache disorder; Encounter for supervision of normal pregnancy in third trimester; Alpha thalassemia silent carrier; and [redacted] weeks gestation of pregnancy on their problem list.  Patient doing well with no acute concerns today. She reports backache.  Contractions: Not present. Vag. Bleeding: None.  Movement: Present. Denies leaking of fluid.   The following portions of the patient's history were reviewed and updated as appropriate: allergies, current medications, past family history, past medical history, past social history, past surgical history and problem list. Problem list updated.  Objective:   Vitals:   01/20/21 1031  BP: 124/77  Pulse: 91  Weight: 184 lb (83.5 kg)    Fetal Status: Fetal Heart Rate (bpm): 136 Fundal Height: 37 cm Movement: Present     General:  Alert, oriented and cooperative. Patient is in no acute distress.  Skin: Skin is warm and dry. No rash noted.   Cardiovascular: Normal heart rate noted  Respiratory: Normal respiratory effort, no problems with respiration noted  Abdomen: Soft, gravid, appropriate for gestational age.  Pain/Pressure: Present     Pelvic: Cervical exam performed Dilation: 1 Effacement (%): 40 Station: -2  Extremities: Normal range of motion.  Edema: None  Mental Status:  Normal mood and affect. Normal behavior. Normal judgment and thought content.   Assessment and Plan:  Pregnancy: G2P1001 at [redacted]w[redacted]d  1. Encounter for supervision of other normal pregnancy in third trimester Continue routine care - Culture, beta strep (group b only) - Cervicovaginal ancillary only( [place])  2. [redacted] weeks gestation of pregnancy   3. Alpha thalassemia silent carrier   Preterm labor symptoms and general obstetric precautions including but  not limited to vaginal bleeding, contractions, leaking of fluid and fetal movement were reviewed in detail with the patient.  Please refer to After Visit Summary for other counseling recommendations.   Return in about 1 week (around 01/27/2021) for ROB, virtual.   Mariel Aloe, MD Faculty Attending Center for [facility]

## 2021-01-20 NOTE — Progress Notes (Signed)
ROB/GBS. Reports no complaints today.

## 2021-01-23 ENCOUNTER — Encounter (HOSPITAL_COMMUNITY): Payer: Self-pay | Admitting: Family Medicine

## 2021-01-23 ENCOUNTER — Other Ambulatory Visit: Payer: Self-pay

## 2021-01-23 ENCOUNTER — Inpatient Hospital Stay (HOSPITAL_COMMUNITY)
Admission: AD | Admit: 2021-01-23 | Discharge: 2021-01-23 | Disposition: A | Discharge status: Home / Self Care | Payer: Medicaid Other | Attending: Family Medicine | Admitting: Family Medicine

## 2021-01-23 DIAGNOSIS — O368130 Decreased fetal movements, third trimester, not applicable or unspecified: Secondary | ICD-10-CM

## 2021-01-23 DIAGNOSIS — Z3A36 36 weeks gestation of pregnancy: Secondary | ICD-10-CM

## 2021-01-23 DIAGNOSIS — R109 Unspecified abdominal pain: Secondary | ICD-10-CM | POA: Diagnosis not present

## 2021-01-23 DIAGNOSIS — Z3689 Encounter for other specified antenatal screening: Secondary | ICD-10-CM | POA: Diagnosis not present

## 2021-01-23 DIAGNOSIS — O26893 Other specified pregnancy related conditions, third trimester: Secondary | ICD-10-CM | POA: Insufficient documentation

## 2021-01-23 LAB — CERVICOVAGINAL ANCILLARY ONLY
Chlamydia: NEGATIVE
Comment: NEGATIVE
Comment: NORMAL
Neisseria Gonorrhea: NEGATIVE

## 2021-01-23 NOTE — MAU Note (Signed)
Movement noted by pt and nurse when listening to FH. "First time he has moved all day".

## 2021-01-23 NOTE — MAU Note (Signed)
Been having real bad acid,called dr- was told she could take tums, did help. Woke up this morning around 5,  feeling pain on the sides- only comes when she breathes, baby wasn't moving.  Ate something,  usually moves after that, still isn't moving. No bleeding or leaking.

## 2021-01-23 NOTE — MAU Provider Note (Signed)
History   [id number]   Chief Complaint  Patient presents with   Decreased Fetal Movement   Abdominal Pain    HPI Vicki Harmon is a 20 y.o. female  G2P1001 here with report of decreased fetal movement since 5 am this morning. States she woke up early this morning & took tums for heartburn. Was concerned the tums affected her baby because afterwards she didn't feel movement. Ate breakfast & pressed on her abdomen without results. Since arriving to MAU has felt baby move once. Heartburn has resolved. Denies contractions, vaginal bleeding, or LOF.  Patient's last menstrual period was 05/02/2020.  OB History  Gravida Para Term Preterm AB Living  _0 SAB IAB Ectopic Multiple Live Births          1    # Outcome Date GA Lbr Len/2nd Weight Sex Delivery Anes PTL Lv  2 Current           1 Term 05/19/17 [redacted]w[redacted]d  M  EPI  LIV    Past Medical History:  Diagnosis Date   Chlamydia     Family History  Problem Relation Age of Onset   Hyperlipidemia Mother    Hypertension Mother     Social History   Socioeconomic History   Marital status: Single    Spouse name: Not on file   Number of children: Not on file   Years of education: Not on file   Highest education level: Not on file  Occupational History   Not on file  Tobacco Use   Smoking status: Never   Smokeless tobacco: Never  Vaping Use   Vaping Use: Never used  Substance and Sexual Activity   Alcohol use: Not Currently    Comment: not since confirmed pregnancy   Drug use: Not Currently    Types: Marijuana    Comment: last used 2 days ago   Sexual activity: Yes    Partners: Male    Birth control/protection: None  Other Topics Concern   Not on file  Social History Narrative   Not on file   Social Determinants of Health   Financial Resource Strain: Not on file  Food Insecurity: Not on file  Transportation Needs: Not on file  Physical Activity: Not on file  Stress: Not on file  Social Connections: Not on  file    Allergies  Allergen Reactions   Shellfish Allergy Swelling    No current facility-administered medications on file prior to encounter.   Current Outpatient Medications on File Prior to Encounter  Medication Sig Dispense Refill   Blood Pressure Monitoring (BLOOD PRESSURE KIT) DEVI 1 kit by Does not apply route once a week. 1 each 0   Iron Polysacch Cmplx-B12-FA 150-0.025-1 MG CAPS Take 1 capsule by mouth every other day. 30 capsule 5   Prenatal Vit-Fe Phos-FA-Omega (VITAFOL GUMMIES) 3.33-0.333-34.8 MG CHEW Chew 3 tablets by mouth daily before breakfast. 90 tablet 11   UNABLE TO FIND Med Name: pt states taking otc womens multivitamin instead of prenatal vitamin (Patient not taking: No sig reported)       Review of Systems  Constitutional: Negative.   Gastrointestinal: Negative.   Genitourinary: Negative.     Physical Exam   Vitals:   01/23/21 1226 01/23/21 1251 01/23/21 1310 01/23/21 1315  BP: 126/74 127/72  124/82  Pulse: 96 95  95  Resp: 18     Temp: 98.4 F (36.9 C)     TempSrc: Oral  SpO2: 98%  100%   Weight: 83.2 kg     Height: _0  (1.778 m)       Physical Exam Vitals and nursing note reviewed.  Constitutional:      General: She is not in acute distress.    Appearance: She is well-developed.  HENT:     Head: Normocephalic and atraumatic.  Eyes:     General: No scleral icterus. Pulmonary:     Effort: Pulmonary effort is normal. No respiratory distress.  Abdominal:     Palpations: Abdomen is soft.     Tenderness: There is no abdominal tenderness.  Skin:    General: Skin is warm and dry.  Neurological:     General: No focal deficit present.     Mental Status: She is alert.  Psychiatric:        Mood and Affect: Mood normal.        Behavior: Behavior normal.   NST:  Baseline: 135 bpm, Variability: Good {> 6 bpm), Accelerations: Reactive, and Decelerations: Absent  MAU Course  Procedures  MDM Presents with decreased movement since this  morning. While in MAU documented 10+ movements during her NST. Had a reactive NST. Patient reassures.  Heartburn resolved & currently denies pain. Discussed safety of tums in pregnancy. If continues to need tums regularly may consider daily PPI  Assessment and Plan   1. Decreased fetal movements in third trimester, single or unspecified fetus   2. NST (non-stress test) reactive   3. [redacted] weeks gestation of pregnancy    -reviewed fetal movement form & reasons to return to MAU -Keep scheduled OB appointment on Thursday   Jorje Guild, NP 01/23/2021 1:12 PM

## 2021-01-25 LAB — CULTURE, BETA STREP (GROUP B ONLY): Strep Gp B Culture: NEGATIVE

## 2021-01-26 ENCOUNTER — Telehealth (INDEPENDENT_AMBULATORY_CARE_PROVIDER_SITE_OTHER): Payer: Medicaid Other

## 2021-01-26 VITALS — BP 126/78 | HR 85

## 2021-01-26 DIAGNOSIS — Z3A37 37 weeks gestation of pregnancy: Secondary | ICD-10-CM

## 2021-01-26 DIAGNOSIS — O99013 Anemia complicating pregnancy, third trimester: Secondary | ICD-10-CM

## 2021-01-26 DIAGNOSIS — Z3483 Encounter for supervision of other normal pregnancy, third trimester: Secondary | ICD-10-CM

## 2021-01-26 DIAGNOSIS — D563 Thalassemia minor: Secondary | ICD-10-CM

## 2021-01-26 NOTE — Progress Notes (Signed)
   OBSTETRICS PRENATAL VIRTUAL VISIT ENCOUNTER NOTE  [facility]: [facility]   Patient location: Home  I connected with [name] on 01/26/21 at 10:35 AM EST by MyChart Video Encounter and verified that I am speaking with the correct person using two identifiers. I discussed the limitations, risks, security and privacy concerns of performing an evaluation and management service virtually and the availability of in person appointments. I also discussed with the patient that there may be a patient responsible charge related to this service. The patient expressed understanding and agreed to proceed. Subjective:  Vicki Harmon is a 20 y.o. G2P1001 at [redacted]w[redacted]d being seen today for ongoing prenatal care.  She is currently monitored for the following issues for this low-risk pregnancy and has Headache disorder; Encounter for supervision of normal pregnancy in third trimester; Alpha thalassemia silent carrier; and [redacted] weeks gestation of pregnancy on their problem list.  Patient reports no complaints. She endorses fetal movement and denies vaginal concerns.  She questions if she should start pumping as she desires to breastfeed.  Contractions: Not present. Vag. Bleeding: None.  Movement: Present. Denies any leaking of fluid.   The following portions of the patient's history were reviewed and updated as appropriate: allergies, current medications, past family history, past medical history, past social history, past surgical history and problem list.   Objective:   Vitals:   01/26/21 1038 01/26/21 1147  BP: (!) 147/87 126/78  Pulse: 77 85    Fetal Status:     Movement: Present     General:  Alert, oriented and cooperative. Patient is in no acute distress.  Respiratory: Normal respiratory effort, no problems with respiration noted  Mental Status: Normal mood and affect. Normal behavior. Normal judgment and thought content.  Rest of physical exam deferred due to type  of encounter  Imaging: No results found.  Assessment and Plan:  Pregnancy: G2P1001 at [redacted]w[redacted]d 1. Encounter for supervision of other normal pregnancy in third trimester -Anticipatory guidance for upcoming appts. -Patient to schedule next appt in 1 weeks for an in-person visit. -Reviewed GBS results; negative  2. Alpha thalassemia silent carrier -HgB at 9.6 on 8/31 -Taking iron supplement every other day.  3. [redacted] weeks gestation of pregnancy -Doing well. -Instructed to not start pumping yet. -Reviewed birth control options. Considering Nexplanon-has used in the past with good results.    Term labor symptoms and general obstetric precautions including but not limited to vaginal bleeding, contractions, leaking of fluid and fetal movement were reviewed in detail with the patient. I discussed the assessment and treatment plan with the patient. The patient was provided an opportunity to ask questions and all were answered. The patient agreed with the plan and demonstrated an understanding of the instructions. The patient was advised to call back or seek an in-person office evaluation/go to MAU at [facility] for any urgent or concerning symptoms. Please refer to After Visit Summary for other counseling recommendations.   I provided 8 minutes of face-to-face time during this encounter.  No follow-ups on file.  No future appointments.  Cherre Robins, CNM Center for [employer], [facility]

## 2021-02-08 ENCOUNTER — Encounter: Payer: Self-pay | Admitting: Obstetrics and Gynecology

## 2021-02-08 ENCOUNTER — Ambulatory Visit (INDEPENDENT_AMBULATORY_CARE_PROVIDER_SITE_OTHER): Payer: Medicaid Other | Admitting: Obstetrics and Gynecology

## 2021-02-08 ENCOUNTER — Other Ambulatory Visit: Payer: Self-pay

## 2021-02-08 VITALS — BP 122/80 | HR 125 | Wt 190.0 lb

## 2021-02-08 DIAGNOSIS — D563 Thalassemia minor: Secondary | ICD-10-CM

## 2021-02-08 DIAGNOSIS — Z3483 Encounter for supervision of other normal pregnancy, third trimester: Secondary | ICD-10-CM

## 2021-02-08 NOTE — Progress Notes (Signed)
Subjective:  Vicki Harmon is a 20 y.o. G2P1001 at [redacted]w[redacted]d being seen today for ongoing prenatal care.  She is currently monitored for the following issues for this low-risk pregnancy and has Headache disorder; Encounter for supervision of normal pregnancy in third trimester; and Alpha thalassemia silent carrier on their problem list.  Patient reports general discomforts of pregnancy.  Contractions: Not present. Vag. Bleeding: None.  Movement: Present. Denies leaking of fluid.   The following portions of the patient's history were reviewed and updated as appropriate: allergies, current medications, past family history, past medical history, past social history, past surgical history and problem list. Problem list updated.  Objective:   Vitals:   02/08/21 1551 02/08/21 1554  BP: 135/90 122/80  Pulse:  (!) 125  Weight: 190 lb (86.2 kg)     Fetal Status: Fetal Heart Rate (bpm): 140   Movement: Present     General:  Alert, oriented and cooperative. Patient is in no acute distress.  Skin: Skin is warm and dry. No rash noted.   Cardiovascular: Normal heart rate noted  Respiratory: Normal respiratory effort, no problems with respiration noted  Abdomen: Soft, gravid, appropriate for gestational age. Pain/Pressure: Present     Pelvic:  Cervical exam performed        Extremities: Normal range of motion.     Mental Status: Normal mood and affect. Normal behavior. Normal judgment and thought content.   Urinalysis:      Assessment and Plan:  Pregnancy: G2P1001 at [redacted]w[redacted]d  1. Encounter for supervision of other normal pregnancy in third trimester Stable Labor precautions IOL scheudled  2. Alpha thalassemia silent carrier Stable  Term labor symptoms and general obstetric precautions including but not limited to vaginal bleeding, contractions, leaking of fluid and fetal movement were reviewed in detail with the patient. Please refer to After Visit Summary for other counseling recommendations.   Return in about 1 week (around 02/15/2021) for OB visit, face to face, any provider, plus NST.   Hermina Staggers, MD

## 2021-02-08 NOTE — Progress Notes (Signed)
Pt states increase in pelvic pressure. 

## 2021-02-08 NOTE — Patient Instructions (Signed)
Vaginal Delivery ?Vaginal delivery means that you give birth by pushing your baby out of your birth canal (vagina). Your health care team will help you before, during, and after vaginal delivery. ?Birth experiences are unique for every woman and every pregnancy, and birth experiences vary depending on where you choose to give birth. ?What are the risks and benefits? ?Generally, this is safe. However, problems may occur, including: ?Bleeding. ?Infection. ?Damage to other structures such as vaginal tearing. ?Allergic reactions to medicines. ?Despite the risks, benefits of vaginal delivery include less risk of bleeding and infection and a shorter recovery time compared to a Cesarean delivery. Cesarean delivery, or C-section, is the surgical delivery of a baby. ?What happens when I arrive at the birth center or hospital? ?Once you are in labor and have been admitted into the hospital or birth center, your health care team may: ?Review your pregnancy history and any concerns that you have. ?Talk with you about your birth plan and discuss pain control options. ?Check your blood pressure, breathing, and heartbeat. ?Assess your baby's heartbeat. ?Monitor your uterus for contractions. ?Check whether your bag of water (amniotic sac) has broken (ruptured). ?Insert an IV into one of your veins. This may be used to give you fluids and medicines. ?Monitoring ?Your health care team may assess your contractions (uterine monitoring) and your baby's heart rate (fetal monitoring). You may need to be monitored: ?Often, but not continuously (intermittently). ?All the time or for long periods at a time (continuously). Continuous monitoring may be needed if: ?You are taking certain medicines, such as medicine to relieve pain or make your contractions stronger. ?You have pregnancy or labor complications. ?Monitoring may be done by: ?Placing a special stethoscope or a handheld monitoring device on your abdomen to check your baby's heartbeat  and to check for contractions. ?Placing monitors on your abdomen (external monitors) to record your baby's heartbeat and the frequency and length of contractions. ?Placing monitors inside your uterus through your vagina (internal monitors) to record your baby's heartbeat and the frequency, length, and strength of your contractions. Depending on the type of monitor, it may remain in your uterus or on your baby's head until birth. ?Telemetry. This is a type of continuous monitoring that can be done with external or internal monitors. Instead of having to stay in bed, you are able to move around. ?Physical exam ?Your health care team may perform frequent physical exams. This may include: ?Checking how and where your baby is positioned in your uterus. ?Checking your cervix to determine: ?Whether it is thinning out (effacing). ?Whether it is opening up (dilating). ?What happens during labor and delivery? ?Normal labor and delivery is divided into the following three stages: ?Stage 1 ?This is the longest stage of labor. ?Throughout this stage, you will feel contractions. Contractions generally feel mild, infrequent, and irregular at first. They get stronger, more frequent, and more regular as you move through this stage. You may have contractions about every 2-3 minutes. ?This stage ends when your cervix is completely dilated to 4 inches (10 cm) and completely effaced. ?Stage 2 ?This stage starts once your cervix is completely effaced and dilated and lasts until the delivery of your baby. ?This is the stage where you will feel an urge to push your baby out of your vagina. ?You may feel stretching and burning pain, especially when the widest part of your baby's head passes through the vaginal opening (crowning). ?Once your baby is delivered, the umbilical cord will be clamped and   cut. Timing of cutting the cord will depend on your wishes, your baby's health, and your health care provider's practices. ?Your baby will be  placed on your bare chest (skin-to-skin contact) in an upright position and covered with a warm blanket. If you are choosing to breastfeed, watch your baby for feeding cues, like rooting or sucking, and help the baby to your breast for his or her first feeding. ?Stage 3 ?This stage starts immediately after the birth of your baby and ends after you deliver the placenta. ?This stage may take anywhere from 5 to 30 minutes. ?After your baby has been delivered, you will feel contractions as your body expels the placenta. These contractions also help your uterus get smaller and reduce bleeding. ?What can I expect after labor and delivery? ?After labor is over, you and your baby will be assessed closely until you are ready to go home. Your health care team will teach you how to care for yourself and your baby. ?You and your baby may be encouraged to stay in the same room (rooming in) during your hospital stay. This will help promote early bonding and successful breastfeeding. ?Your uterus will be checked and massaged regularly (fundal massage). ?You may continue to receive fluids and medicines through an IV. ?You will have some soreness and pain in your abdomen, vagina, and the area of skin between your vaginal opening and your anus (perineum). ?If an incision was made near your vagina (episiotomy) or if you had some vaginal tearing during delivery, cold compresses may be placed on your episiotomy or your tear. This helps to reduce pain and swelling. ?It is normal to have vaginal bleeding after delivery. Wear a sanitary pad for vaginal bleeding and discharge. ?Summary ?Vaginal delivery means that you will give birth by pushing your baby out of your birth canal (vagina). ?Your health care team will monitor you and your baby throughout the stages of labor. ?After you deliver your baby, your health care team will continue to assess you and your baby to ensure you are both recovering as expected after delivery. ?This  information is not intended to replace advice given to you by your health care provider. Make sure you discuss any questions you have with your health care provider. ?Document Revised: 01/25/2020 Document Reviewed: 01/25/2020 ?Elsevier Patient Education ? 2022 [employer] ? ?

## 2021-02-09 ENCOUNTER — Encounter: Payer: Self-pay | Admitting: *Deleted

## 2021-02-12 ENCOUNTER — Inpatient Hospital Stay (HOSPITAL_COMMUNITY): Payer: Medicaid Other | Admitting: Anesthesiology

## 2021-02-12 ENCOUNTER — Inpatient Hospital Stay (HOSPITAL_COMMUNITY)
Admission: AD | Admit: 2021-02-12 | Discharge: 2021-02-14 | DRG: 807 | Disposition: A | Discharge status: Home / Self Care | Payer: Medicaid Other | Attending: Obstetrics and Gynecology | Admitting: Obstetrics and Gynecology

## 2021-02-12 ENCOUNTER — Encounter (HOSPITAL_COMMUNITY): Payer: Self-pay | Admitting: Obstetrics and Gynecology

## 2021-02-12 ENCOUNTER — Other Ambulatory Visit: Payer: Self-pay

## 2021-02-12 DIAGNOSIS — O133 Gestational [pregnancy-induced] hypertension without significant proteinuria, third trimester: Secondary | ICD-10-CM | POA: Diagnosis not present

## 2021-02-12 DIAGNOSIS — O134 Gestational [pregnancy-induced] hypertension without significant proteinuria, complicating childbirth: Principal | ICD-10-CM | POA: Diagnosis present

## 2021-02-12 DIAGNOSIS — D563 Thalassemia minor: Secondary | ICD-10-CM | POA: Diagnosis not present

## 2021-02-12 DIAGNOSIS — Z3689 Encounter for other specified antenatal screening: Secondary | ICD-10-CM | POA: Diagnosis not present

## 2021-02-12 DIAGNOSIS — Z20822 Contact with and (suspected) exposure to covid-19: Secondary | ICD-10-CM | POA: Diagnosis present

## 2021-02-12 DIAGNOSIS — Z30017 Encounter for initial prescription of implantable subdermal contraceptive: Secondary | ICD-10-CM | POA: Diagnosis not present

## 2021-02-12 DIAGNOSIS — R030 Elevated blood-pressure reading, without diagnosis of hypertension: Secondary | ICD-10-CM | POA: Diagnosis not present

## 2021-02-12 DIAGNOSIS — Z3A39 39 weeks gestation of pregnancy: Secondary | ICD-10-CM

## 2021-02-12 DIAGNOSIS — O9902 Anemia complicating childbirth: Secondary | ICD-10-CM | POA: Diagnosis not present

## 2021-02-12 DIAGNOSIS — D649 Anemia, unspecified: Secondary | ICD-10-CM | POA: Diagnosis not present

## 2021-02-12 DIAGNOSIS — Z3493 Encounter for supervision of normal pregnancy, unspecified, third trimester: Secondary | ICD-10-CM

## 2021-02-12 DIAGNOSIS — O99324 Drug use complicating childbirth: Secondary | ICD-10-CM | POA: Diagnosis not present

## 2021-02-12 LAB — COMPREHENSIVE METABOLIC PANEL
ALT: 14 U/L (ref 0–44)
AST: 22 U/L (ref 15–41)
Albumin: 3 g/dL — ABNORMAL LOW (ref 3.5–5.0)
Alkaline Phosphatase: 166 U/L — ABNORMAL HIGH (ref 38–126)
Anion gap: 9 (ref 5–15)
BUN: 5 mg/dL — ABNORMAL LOW (ref 6–20)
CO2: 20 mmol/L — ABNORMAL LOW (ref 22–32)
Calcium: 9.3 mg/dL (ref 8.9–10.3)
Chloride: 106 mmol/L (ref 98–111)
Creatinine, Ser: 0.65 mg/dL (ref 0.44–1.00)
GFR, Estimated: >60 mL/min (ref >60)
Glucose, Bld: 95 mg/dL (ref 70–99)
Potassium: 4.2 mmol/L (ref 3.5–5.1)
Sodium: 135 mmol/L (ref 135–145)
Total Bilirubin: 0.4 mg/dL (ref 0.3–1.2)
Total Protein: 7.5 g/dL (ref 6.5–8.1)

## 2021-02-12 LAB — CBC
HCT: 32.9 % — ABNORMAL LOW (ref 36.0–46.0)
Hemoglobin: 10.3 g/dL — ABNORMAL LOW (ref 12.0–15.0)
MCH: 25.8 pg — ABNORMAL LOW (ref 26.0–34.0)
MCHC: 31.3 g/dL (ref 30.0–36.0)
MCV: 82.5 fL (ref 80.0–100.0)
Platelets: 316 10*3/uL (ref 150–400)
RBC: 3.99 MIL/uL (ref 3.87–5.11)
RDW: 14.9 % (ref 11.5–15.5)
WBC: 10.3 10*3/uL (ref 4.0–10.5)
nRBC: 0 % (ref 0.0–0.2)

## 2021-02-12 LAB — RESP PANEL BY RT-PCR (FLU A&B, COVID) ARPGX2
Influenza A by PCR: POSITIVE — AB
Influenza B by PCR: NEGATIVE
SARS Coronavirus 2 by RT PCR: NEGATIVE

## 2021-02-12 LAB — URINALYSIS, ROUTINE W REFLEX MICROSCOPIC
Bilirubin Urine: NEGATIVE
Glucose, UA: NEGATIVE mg/dL
Ketones, ur: NEGATIVE mg/dL
Leukocytes,Ua: NEGATIVE
Nitrite: NEGATIVE
Protein, ur: NEGATIVE mg/dL
RBC / HPF: >50 RBC/hpf — ABNORMAL HIGH (ref 0–5)
Specific Gravity, Urine: 1.013 (ref 1.005–1.030)
pH: 6 (ref 5.0–8.0)

## 2021-02-12 LAB — PROTEIN / CREATININE RATIO, URINE
Creatinine, Urine: 82.71 mg/dL
Protein Creatinine Ratio: 0.25 mg/mg{Cre} — ABNORMAL HIGH (ref 0.00–0.15)
Total Protein, Urine: 21 mg/dL

## 2021-02-12 LAB — TYPE AND SCREEN
ABO/RH(D): O POS
Antibody Screen: NEGATIVE

## 2021-02-12 LAB — POCT FERN TEST: POCT Fern Test: NEGATIVE

## 2021-02-12 MED ORDER — LACTATED RINGERS IV SOLN
500.0000 mL | INTRAVENOUS | Status: DC | PRN
Start: 2021-02-12 — End: 2021-02-13

## 2021-02-12 MED ORDER — OXYTOCIN BOLUS FROM INFUSION
333.0000 mL | Freq: Once | INTRAVENOUS | Status: AC
  Administered 2021-02-13: 333 mL via INTRAVENOUS

## 2021-02-12 MED ORDER — OXYTOCIN-SODIUM CHLORIDE 30-0.9 UT/500ML-% IV SOLN
1.0000 m[IU]/min | INTRAVENOUS | Status: DC
  Administered 2021-02-12: 18:00:00 2 m[IU]/min via INTRAVENOUS

## 2021-02-12 MED ORDER — EPHEDRINE 5 MG/ML INJ: 10.0000 mg | INTRAVENOUS | Status: DC | PRN

## 2021-02-12 MED ORDER — OXYCODONE-ACETAMINOPHEN 5-325 MG PO TABS: 2.0000 | ORAL_TABLET | ORAL | Status: DC | PRN

## 2021-02-12 MED ORDER — TERBUTALINE SULFATE 1 MG/ML IJ SOLN: 0.2500 mg | Freq: Once | INTRAMUSCULAR | Status: DC | PRN

## 2021-02-12 MED ORDER — FENTANYL-BUPIVACAINE-NACL 0.5-0.125-0.9 MG/250ML-% EP SOLN
12.0000 mL/h | EPIDURAL | Status: DC | PRN
  Administered 2021-02-12: 16:00:00 12 mL/h via EPIDURAL
  Filled 2021-02-12: qty 250

## 2021-02-12 MED ORDER — LIDOCAINE HCL (PF) 1 % IJ SOLN: 30.0000 mL | INTRAMUSCULAR | Status: DC | PRN

## 2021-02-12 MED ORDER — LACTATED RINGERS IV SOLN: INTRAVENOUS | Status: DC

## 2021-02-12 MED ORDER — LIDOCAINE HCL (PF) 1 % IJ SOLN
INTRAMUSCULAR | Status: DC | PRN
  Administered 2021-02-12 (×2): 5 mL via EPIDURAL

## 2021-02-12 MED ORDER — ACETAMINOPHEN 325 MG PO TABS: 650.0000 mg | ORAL_TABLET | ORAL | Status: DC | PRN

## 2021-02-12 MED ORDER — LACTATED RINGERS IV SOLN: 500.0000 mL | Freq: Once | INTRAVENOUS | Status: DC

## 2021-02-12 MED ORDER — DIPHENHYDRAMINE HCL 50 MG/ML IJ SOLN: 12.5000 mg | INTRAMUSCULAR | Status: DC | PRN

## 2021-02-12 MED ORDER — SOD CITRATE-CITRIC ACID 500-334 MG/5ML PO SOLN: 30.0000 mL | ORAL | Status: DC | PRN

## 2021-02-12 MED ORDER — PHENYLEPHRINE 40 MCG/ML (10ML) SYRINGE FOR IV PUSH (FOR BLOOD PRESSURE SUPPORT)
80.0000 ug | PREFILLED_SYRINGE | INTRAVENOUS | Status: DC | PRN
  Filled 2021-02-12: qty 10

## 2021-02-12 MED ORDER — FENTANYL CITRATE (PF) 100 MCG/2ML IJ SOLN: 50.0000 ug | INTRAMUSCULAR | Status: DC | PRN

## 2021-02-12 MED ORDER — PHENYLEPHRINE 40 MCG/ML (10ML) SYRINGE FOR IV PUSH (FOR BLOOD PRESSURE SUPPORT): 80.0000 ug | PREFILLED_SYRINGE | INTRAVENOUS | Status: DC | PRN

## 2021-02-12 MED ORDER — OXYTOCIN-SODIUM CHLORIDE 30-0.9 UT/500ML-% IV SOLN
2.5000 [IU]/h | INTRAVENOUS | Status: DC
  Filled 2021-02-12: qty 500

## 2021-02-12 MED ORDER — OXYCODONE-ACETAMINOPHEN 5-325 MG PO TABS: 1.0000 | ORAL_TABLET | ORAL | Status: DC | PRN

## 2021-02-12 MED ORDER — ONDANSETRON HCL 4 MG/2ML IJ SOLN: 4.0000 mg | Freq: Four times a day (QID) | INTRAMUSCULAR | Status: DC | PRN

## 2021-02-12 NOTE — H&P (Addendum)
OBSTETRIC ADMISSION HISTORY AND PHYSICAL  Vicki Harmon is a 20 y.o. female G2P1001 with IUP at [redacted]w[redacted]d by 20w US presenting for SOL and reported LOF (was found to be intact in MAU). Elevated BP noted. Meets criteria for GHTN. She reports +FMs, No LOF, no VB, no blurry vision, headaches or peripheral edema, and RUQ pain.  She plans on breast and bottle feeding. She requests Nexplanon for birth control. She received her prenatal care at [facility]   Dating: By 20w US --->  Estimated Date of Delivery: 02/14/21  Sono:    @[redacted]w[redacted]d, CWD, normal anatomy, cephalic presentation, anterior placental lie, 368g, 81% EFW   Prenatal History/Complications:  Alpha thalassemia carrier  Past Medical History: Past Medical History:  Diagnosis Date   Chlamydia     Past Surgical History: Past Surgical History:  Procedure Laterality Date   NO PAST SURGERIES      Obstetrical History: OB History     Gravida  2   Para  1   Term  1   Preterm      AB      Living  1      SAB      IAB      Ectopic      Multiple      Live Births  1           Social History Social History   Socioeconomic History   Marital status: Single    Spouse name: Not on file   Number of children: Not on file   Years of education: Not on file   Highest education level: Not on file  Occupational History   Not on file  Tobacco Use   Smoking status: Never   Smokeless tobacco: Never  Vaping Use   Vaping Use: Never used  Substance and Sexual Activity   Alcohol use: Not Currently    Comment: not since confirmed pregnancy   Drug use: Not Currently    Types: Marijuana    Comment: last used 2 days ago   Sexual activity: Yes    Partners: Male    Birth control/protection: None  Other Topics Concern   Not on file  Social History Narrative   Not on file   Social Determinants of Health   Financial Resource Strain: Not on file  Food Insecurity: Not on file  Transportation Needs: Not on file  Physical  Activity: Not on file  Stress: Not on file  Social Connections: Not on file    Family History: Family History  Problem Relation Age of Onset   Hyperlipidemia Mother    Hypertension Mother     Allergies: Allergies  Allergen Reactions   Shellfish Allergy Swelling    Medications Prior to Admission  Medication Sig Dispense Refill Last Dose   Prenatal Vit-Fe Phos-FA-Omega (VITAFOL GUMMIES) 3.33-0.333-34.8 MG CHEW Chew 3 tablets by mouth daily before breakfast. 90 tablet 11 02/11/2021   Blood Pressure Monitoring (BLOOD PRESSURE KIT) DEVI 1 kit by Does not apply route once a week. 1 each 0    Iron Polysacch Cmplx-B12-FA 150-0.025-1 MG CAPS Take 1 capsule by mouth every other day. (Patient not taking: Reported on 02/08/2021) 30 capsule 5      Review of Systems   All systems reviewed and negative except as stated in HPI  Blood pressure (!) 141/93, pulse 94, temperature 98.4 F (36.9 C), temperature source Oral, resp. rate 18, height 5' 10" (1.778 m), weight 85.7 kg, last menstrual period 05/02/2020, SpO2 98 %.   General appearance: alert, cooperative, and no distress Lungs: normal work of breathing Heart: warm and well perfused Abdomen: soft, non-tender; gravid uterus Extremities: No edema or calf tenderness. Presentation:  Vertex Fetal monitoring: 135 bpm/moderate/ +accels, no decels Uterine activity: present, every 5 minutes Dilation: 4 Effacement (%): 100 Station: -2 Exam by:: Jennifer Rasch NP   Prenatal labs: ABO, Rh: --/--/PENDING (12/04 1437) Antibody: PENDING (12/04 1437) Rubella: 11.70 (05/12 1538) RPR: Non Reactive (08/31 1026)  HBsAg: Negative (05/12 1538)  HIV: Non Reactive (08/31 1026)  GBS: Negative/-- (11/11 1102)  2 hr Glucola: Normal Genetic screening:  Anatomy US: Normal  Prenatal Transfer Tool  Maternal Diabetes: No Genetic Screening: LR NIPS, negative AFP, Alpha thal silent carrier ([facility]) Maternal Ultrasounds/Referrals: Normal Fetal Ultrasounds  or other Referrals:  None Maternal Substance Abuse:  Yes:  Type: Marijuana Significant Maternal Medications:  None Significant Maternal Lab Results: Group B Strep negative  Results for orders placed or performed during the hospital encounter of 02/12/21 (from the past 24 hour(s))  Urinalysis, Routine w reflex microscopic Urine, Clean Catch   Collection Time: 02/12/21 11:54 AM  Result Value Ref Range   Color, Urine YELLOW YELLOW   APPearance CLOUDY (A) CLEAR   Specific Gravity, Urine 1.013 1.005 - 1.030   pH 6.0 5.0 - 8.0   Glucose, UA NEGATIVE NEGATIVE mg/dL   Hgb urine dipstick MODERATE (A) NEGATIVE   Bilirubin Urine NEGATIVE NEGATIVE   Ketones, ur NEGATIVE NEGATIVE mg/dL   Protein, ur NEGATIVE NEGATIVE mg/dL   Nitrite NEGATIVE NEGATIVE   Leukocytes,Ua NEGATIVE NEGATIVE   RBC / HPF >50 (H) 0 - 5 RBC/hpf   WBC, UA 6-10 0 - 5 WBC/hpf   Bacteria, UA FEW (A) NONE SEEN   Squamous Epithelial / LPF 21-50 0 - 5  Protein / creatinine ratio, urine   Collection Time: 02/12/21 11:54 AM  Result Value Ref Range   Creatinine, Urine 82.71 mg/dL   Total Protein, Urine 21 mg/dL   Protein Creatinine Ratio 0.25 (H) 0.00 - 0.15 mg/mg[Cre]  POCT fern test   Collection Time: 02/12/21 12:53 PM  Result Value Ref Range   POCT Fern Test Negative = intact amniotic membranes   Resp Panel by RT-PCR (Flu A&B, Covid) Nasopharyngeal Swab   Collection Time: 02/12/21  2:37 PM   Specimen: Nasopharyngeal Swab; Nasopharyngeal(NP) swabs in vial transport medium  Result Value Ref Range   SARS Coronavirus 2 by RT PCR NEGATIVE NEGATIVE   Influenza A by PCR POSITIVE (A) NEGATIVE   Influenza B by PCR NEGATIVE NEGATIVE  CBC   Collection Time: 02/12/21  2:37 PM  Result Value Ref Range   WBC 10.3 4.0 - 10.5 K/uL   RBC 3.99 3.87 - 5.11 MIL/uL   Hemoglobin 10.3 (L) 12.0 - 15.0 g/dL   HCT 32.9 (L) 36.0 - 46.0 %   MCV 82.5 80.0 - 100.0 fL   MCH 25.8 (L) 26.0 - 34.0 pg   MCHC 31.3 30.0 - 36.0 g/dL   RDW 14.9 11.5 -  15.5 %   Platelets 316 150 - 400 K/uL   nRBC 0.0 0.0 - 0.2 %  Comprehensive metabolic panel   Collection Time: 02/12/21  2:37 PM  Result Value Ref Range   Sodium 135 135 - 145 mmol/L   Potassium 4.2 3.5 - 5.1 mmol/L   Chloride 106 98 - 111 mmol/L   CO2 20 (L) 22 - 32 mmol/L   Glucose, Bld 95 70 - 99 mg/dL   BUN 5 (L) 6 -   20 mg/dL   Creatinine, Ser 0.65 0.44 - 1.00 mg/dL   Calcium 9.3 8.9 - 10.3 mg/dL   Total Protein 7.5 6.5 - 8.1 g/dL   Albumin 3.0 (L) 3.5 - 5.0 g/dL   AST 22 15 - 41 U/L   ALT 14 0 - 44 U/L   Alkaline Phosphatase 166 (H) 38 - 126 U/L   Total Bilirubin 0.4 0.3 - 1.2 mg/dL   GFR, Estimated >60 >60 mL/min   Anion gap 9 5 - 15  Type and screen   Collection Time: 02/12/21  2:37 PM  Result Value Ref Range   ABO/RH(D) O POS    Antibody Screen NEG    Sample Expiration      02/15/2021,2359 Performed at [facility], [address] [place], [place] [address]     Patient Active Problem List   Diagnosis Date Noted   Alpha thalassemia silent carrier 11/23/2020   Encounter for supervision of normal pregnancy in third trimester 07/14/2020   Headache disorder 10/30/2017    Assessment/Plan:  Vicki Harmon is a 20 y.o. G2P1001 at [redacted]w[redacted]d here for SOL.  #Labor: Expectant management. Augmentation as indicated. Anticipate vaginal delivery. #Pain: Epidural #FWB: Cat 1 #ID:  GBS neg #MOF: breast and bottle #MOC: Depo  #Circ:  Yes #gHTN: Elevated BPs in MAU with no symptoms. No hx of gHTN with previous pregnancy. Improved after epidural. Will continue to monitor. #Flu A+, asymptomatic. No intervention indicated.     Valeria Ruth Glanton, MD  02/12/2021, 3:08 PM  Smith, [place], CNM 02/12/2021 9:14 PM   

## 2021-02-12 NOTE — Anesthesia Preprocedure Evaluation (Signed)
Anesthesia Evaluation  Patient identified by MRN, date of birth, ID band Patient awake    Reviewed: Allergy & Precautions, H&P , NPO status , Patient's Chart, lab work & pertinent test results  Airway Mallampati: II   Neck ROM: full    Dental   Pulmonary neg pulmonary ROS,    breath sounds clear to auscultation       Cardiovascular negative cardio ROS   Rhythm:regular Rate:Normal     Neuro/Psych  Headaches,    GI/Hepatic   Endo/Other    Renal/GU      Musculoskeletal   Abdominal   Peds  Hematology  (+) anemia ,   Anesthesia Other Findings   Reproductive/Obstetrics (+) Pregnancy                             Anesthesia Physical Anesthesia Plan  ASA: 2  Anesthesia Plan: Epidural   Post-op Pain Management:    Induction: Intravenous  PONV Risk Score and Plan: 2 and Treatment may vary due to age or medical condition  Airway Management Planned: Natural Airway  Additional Equipment:   Intra-op Plan:   Post-operative Plan:   Informed Consent: I have reviewed the patients History and Physical, chart, labs and discussed the procedure including the risks, benefits and alternatives for the proposed anesthesia with the patient or authorized representative who has indicated his/her understanding and acceptance.     Dental advisory given  Plan Discussed with: Anesthesiologist  Anesthesia Plan Comments:         Anesthesia Quick Evaluation

## 2021-02-12 NOTE — Anesthesia Procedure Notes (Signed)
Epidural Patient location during procedure: OB Start time: 02/12/2021 3:33 PM End time: 02/12/2021 3:43 PM  Staffing Anesthesiologist: Achille Rich, MD Performed: anesthesiologist   Preanesthetic Checklist Completed: patient identified, IV checked, site marked, risks and benefits discussed, monitors and equipment checked, pre-op evaluation and timeout performed  Epidural Patient position: sitting Prep: DuraPrep Patient monitoring: heart rate, cardiac monitor, continuous pulse ox and blood pressure Approach: midline Location: L2-L3 Injection technique: LOR saline  Needle:  Needle type: Tuohy  Needle gauge: 17 G Needle length: 9 cm Needle insertion depth: 6 cm Catheter type: closed end flexible Catheter size: 19 Gauge Catheter at skin depth: 12 cm Test dose: negative and Other  Assessment Events: blood not aspirated, injection not painful, no injection resistance and negative IV test  Additional Notes Informed consent obtained prior to proceeding including risk of failure, 1% risk of PDPH, risk of minor discomfort and bruising.  Discussed rare but serious complications including epidural abscess, permanent nerve injury, epidural hematoma.  Discussed alternatives to epidural analgesia and patient desires to proceed.  Timeout performed pre-procedure verifying patient name, procedure, and platelet count.  Patient tolerated procedure well. Reason for block:procedure for pain

## 2021-02-12 NOTE — MAU Note (Signed)
Presents with c/o LOF since 1100 this morning, reports fluid is clear.  Reports also had spotting with wiping.  Endorses +FM.

## 2021-02-12 NOTE — MAU Provider Note (Signed)
S: Ms. Vicki Harmon is a 20 y.o. G2P1001 at [redacted]w[redacted]d  who presents to MAU today complaining of leaking of fluid since 1100. She denies vaginal bleeding. She endorses contractions. She reports normal fetal movement.    O: BP 120/86 (BP Location: Right Arm)   Pulse (!) 102   Temp 98.4 F (36.9 C) (Oral)   Resp 18   Ht 5\' 10"  (1.778 m)   Wt 85.7 kg   LMP 05/02/2020   SpO2 98%   BMI 27.12 kg/m  Patient Vitals for the past 24 hrs:  BP Temp Temp src Pulse Resp SpO2 Height Weight  02/12/21 1416 (!) 141/93 98.1 F (36.7 C) Oral 94 16 -- 5\' 10"  (1.778 m) 86.2 kg  02/12/21 1402 (!) 143/93 -- -- 90 -- -- -- --  02/12/21 1346 139/87 -- -- 82 -- -- -- --  02/12/21 1331 (!) 153/97 -- -- 89 -- -- -- --  02/12/21 1316 133/87 -- -- 79 -- -- -- --  02/12/21 1301 134/87 -- -- 83 -- -- -- --  02/12/21 1257 136/85 -- -- 92 18 -- -- --  02/12/21 1211 -- -- -- -- -- 98 % -- --  02/12/21 1210 120/86 -- -- (!) 102 18 -- -- --  02/12/21 1135 140/90 98.4 F (36.9 C) Oral 99 18 100 % -- --  02/12/21 1131 -- -- -- -- -- -- 5\' 10"  (1.778 m) 85.7 kg     GENERAL: Well-developed, well-nourished female in no acute distress.  HEAD: Normocephalic, atraumatic.  CHEST: Normal effort of breathing, regular heart rate ABDOMEN: Soft, nontender, gravid PELVIC: Normal external female genitalia. Vagina is pink and rugated. Cervix with normal contour, no lesions. Normal discharge.  Negative pooling; large amount of blood show/mucoid discharge   Cervical exam:   Dilation: 4 Effacement (%): 100 Station: -2 Presentation: Vertex Exam by:: 14/04/22 NP   Recheck of cervix unchanged, however now with bulging membranes.   Fetal Monitoring: Baseline: 135 bpm Variability: Moderate  Accelerations: 15x15 Decelerations: None Contractions: 2-5  No results found for this or any previous visit (from the past 24 hour(s)).   A: SIUP at [redacted]w[redacted]d  Membranes intact [id number]  P:  Admit to labor PIH labs collected     Venia Carbon, NP 02/12/2021 3:38 PM

## 2021-02-12 NOTE — MAU Provider Note (Signed)
Pt informed that the ultrasound is considered a limited OB ultrasound and is not intended to be a complete ultrasound exam.  Patient also informed that the ultrasound is not being completed with the intent of assessing for fetal or placental anomalies or any pelvic abnormalities.  Explained that the purpose of today's ultrasound is to assess for  presentation.  Patient acknowledges the purpose of the exam and the limitations of the study.     Vertex position.  Venia Carbon I, NP 02/12/2021 2:08 PM

## 2021-02-12 NOTE — Progress Notes (Signed)
Vicki Harmon is a 20 y.o. G2P1001 at [redacted]w[redacted]d admitted for induction of labor due to  SOL and now with intrapartum gHTN.  Subjective: Reports she feels more pressure. Not constant  Objective: BP 135/85   Pulse 80   Temp 97.6 F (36.4 C) (Axillary)   Resp 18   Ht 5\' 10"  (1.778 m)   Wt 86.2 kg   LMP 05/02/2020   SpO2 100%   BMI 27.26 kg/m  No intake/output data recorded. Total I/O In: -  Out: 600 [Urine:600]  FHT:  FHR: 130 bpm, variability: moderate,  accelerations:  Present,  decelerations:  Absent UC:   regular, every 1-2 minutes SVE:   Dilation: 8 Effacement (%): 80 Station: -1 Exam by:: [email or web address]  Labs: Lab Results  Component Value Date   WBC 10.3 02/12/2021   HGB 10.3 (L) 02/12/2021   HCT 32.9 (L) 02/12/2021   MCV 82.5 02/12/2021   PLT 316 02/12/2021    Assessment / Plan: [redacted]w[redacted]d admitted for induction of labor due to  SOL and now with intrapartum gHTN  Labor: Progressed to 8 cm on Pitocin, station -1. Contracting every 1-3 min. Continue pitocin at current dose given contracting frequently and making cervical change. If contractions space out can go up on pitocin.  gHTN:   last few BP normotensive to mild range. Continue to monitor Fetal Wellbeing:  Category I Pain Control:  Epidural I/D:   GBS neg   [redacted]w[redacted]d 02/12/2021, 9:25 PM

## 2021-02-12 NOTE — Progress Notes (Signed)
Epsie Walthall is a 20 y.o. G2P1001 at [redacted]w[redacted]d.  Subjective: Comfortable w/ epidural.   Objective: BP 135/85   Pulse 80   Temp 97.6 F (36.4 C) (Axillary)   Resp 18   Ht 5\' 10"  (1.778 m)   Wt 86.2 kg   LMP 05/02/2020   SpO2 100%   BMI 27.26 kg/m    FHT:  FHR: 130 bpm, variability: mod,  accelerations:  15x15,  decelerations:  none UC:   Q 7 minutes, mod Dilation: 5.5 Effacement (%): 80 Cervical Position: Anterior Station: -1 Presentation: Vertex Exam by:: [email or web address], RN  Labs: Results for orders placed or performed during the hospital encounter of 02/12/21 (from the past 24 hour(s))  Urinalysis, Routine w reflex microscopic Urine, Clean Catch     Status: Abnormal   Collection Time: 02/12/21 11:54 AM  Result Value Ref Range   Color, Urine YELLOW YELLOW   APPearance CLOUDY (A) CLEAR   Specific Gravity, Urine 1.013 1.005 - 1.030   pH 6.0 5.0 - 8.0   Glucose, UA NEGATIVE NEGATIVE mg/dL   Hgb urine dipstick MODERATE (A) NEGATIVE   Bilirubin Urine NEGATIVE NEGATIVE   Ketones, ur NEGATIVE NEGATIVE mg/dL   Protein, ur NEGATIVE NEGATIVE mg/dL   Nitrite NEGATIVE NEGATIVE   Leukocytes,Ua NEGATIVE NEGATIVE   RBC / HPF >50 (H) 0 - 5 RBC/hpf   WBC, UA 6-10 0 - 5 WBC/hpf   Bacteria, UA FEW (A) NONE SEEN   Squamous Epithelial / LPF 21-50 0 - 5  Protein / creatinine ratio, urine     Status: Abnormal   Collection Time: 02/12/21 11:54 AM  Result Value Ref Range   Creatinine, Urine 82.71 mg/dL   Total Protein, Urine 21 mg/dL   Protein Creatinine Ratio 0.25 (H) 0.00 - 0.15 mg/mg[Cre]  POCT fern test     Status: None   Collection Time: 02/12/21 12:53 PM  Result Value Ref Range   POCT Fern Test Negative = intact amniotic membranes   CBC     Status: Abnormal   Collection Time: 02/12/21  2:37 PM  Result Value Ref Range   WBC 10.3 4.0 - 10.5 K/uL   RBC 3.99 3.87 - 5.11 MIL/uL   Hemoglobin 10.3 (L) 12.0 - 15.0 g/dL   HCT 14/04/22 (L) 30.1 - 60.1 %   MCV 82.5 80.0 - 100.0 fL   MCH  25.8 (L) 26.0 - 34.0 pg   MCHC 31.3 30.0 - 36.0 g/dL   RDW 09.3 23.5 - 57.3 %   Platelets 316 150 - 400 K/uL   nRBC 0.0 0.0 - 0.2 %  Comprehensive metabolic panel     Status: Abnormal   Collection Time: 02/12/21  2:37 PM  Result Value Ref Range   Sodium 135 135 - 145 mmol/L   Potassium 4.2 3.5 - 5.1 mmol/L   Chloride 106 98 - 111 mmol/L   CO2 20 (L) 22 - 32 mmol/L   Glucose, Bld 95 70 - 99 mg/dL   BUN 5 (L) 6 - 20 mg/dL   Creatinine, Ser 14/04/22 0.44 - 1.00 mg/dL   Calcium 9.3 8.9 - 2.54 mg/dL   Total Protein 7.5 6.5 - 8.1 g/dL   Albumin 3.0 (L) 3.5 - 5.0 g/dL   AST 22 15 - 41 U/L   ALT 14 0 - 44 U/L   Alkaline Phosphatase 166 (H) 38 - 126 U/L   Total Bilirubin 0.4 0.3 - 1.2 mg/dL   GFR, Estimated 27.0 >62 mL/min   Anion gap  9 5 - 15  Type and screen     Status: None   Collection Time: 02/12/21  2:37 PM  Result Value Ref Range   ABO/RH(D) O POS    Antibody Screen NEG    Sample Expiration      02/15/2021,2359 Performed at [facility], [address], [place], [place] [address]   Resp Panel by RT-PCR (Flu A&B, Covid) Nasopharyngeal Swab     Status: Abnormal   Collection Time: 02/12/21  2:37 PM   Specimen: Nasopharyngeal Swab; Nasopharyngeal(NP) swabs in vial transport medium  Result Value Ref Range   SARS Coronavirus 2 by RT PCR NEGATIVE NEGATIVE   Influenza A by PCR POSITIVE (A) NEGATIVE   Influenza B by PCR NEGATIVE NEGATIVE    Assessment / Plan: [redacted]w[redacted]d week IUP Labor: Early. Inadequate contractions. Start Pitocin Fetal Wellbeing:  Category I Pain Control:  Epidural.  Anticipated MOD:  SVD  Dorathy Kinsman, CNM 02/12/2021 5:49 PM

## 2021-02-13 ENCOUNTER — Encounter (HOSPITAL_COMMUNITY): Payer: Self-pay | Admitting: Obstetrics and Gynecology

## 2021-02-13 DIAGNOSIS — Z3A39 39 weeks gestation of pregnancy: Secondary | ICD-10-CM

## 2021-02-13 DIAGNOSIS — O99324 Drug use complicating childbirth: Secondary | ICD-10-CM

## 2021-02-13 DIAGNOSIS — O134 Gestational [pregnancy-induced] hypertension without significant proteinuria, complicating childbirth: Secondary | ICD-10-CM

## 2021-02-13 LAB — RPR: RPR Ser Ql: NONREACTIVE

## 2021-02-13 LAB — CBC
HCT: 30.9 % — ABNORMAL LOW (ref 36.0–46.0)
Hemoglobin: 9.4 g/dL — ABNORMAL LOW (ref 12.0–15.0)
MCH: 25.2 pg — ABNORMAL LOW (ref 26.0–34.0)
MCHC: 30.4 g/dL (ref 30.0–36.0)
MCV: 82.8 fL (ref 80.0–100.0)
Platelets: 272 10*3/uL (ref 150–400)
RBC: 3.73 MIL/uL — ABNORMAL LOW (ref 3.87–5.11)
RDW: 15 % (ref 11.5–15.5)
WBC: 13.6 10*3/uL — ABNORMAL HIGH (ref 4.0–10.5)
nRBC: 0 % (ref 0.0–0.2)

## 2021-02-13 MED ORDER — TETANUS-DIPHTH-ACELL PERTUSSIS 5-2.5-18.5 LF-MCG/0.5 IM SUSY: 0.5000 mL | PREFILLED_SYRINGE | Freq: Once | INTRAMUSCULAR | Status: DC

## 2021-02-13 MED ORDER — SENNOSIDES-DOCUSATE SODIUM 8.6-50 MG PO TABS
2.0000 | ORAL_TABLET | Freq: Every day | ORAL | Status: DC
  Filled 2021-02-13: qty 2

## 2021-02-13 MED ORDER — BENZOCAINE-MENTHOL 20-0.5 % EX AERO
1.0000 "application " | INHALATION_SPRAY | CUTANEOUS | Status: DC | PRN
  Administered 2021-02-13: 1 via TOPICAL
  Filled 2021-02-13: qty 56

## 2021-02-13 MED ORDER — ONDANSETRON HCL 4 MG PO TABS: 4.0000 mg | ORAL_TABLET | ORAL | Status: DC | PRN

## 2021-02-13 MED ORDER — MEASLES, MUMPS & RUBELLA VAC IJ SOLR: 0.5000 mL | Freq: Once | INTRAMUSCULAR | Status: DC

## 2021-02-13 MED ORDER — DIPHENHYDRAMINE HCL 25 MG PO CAPS: 25.0000 mg | ORAL_CAPSULE | Freq: Four times a day (QID) | ORAL | Status: DC | PRN

## 2021-02-13 MED ORDER — DIBUCAINE (PERIANAL) 1 % EX OINT: 1.0000 "application " | TOPICAL_OINTMENT | CUTANEOUS | Status: DC | PRN

## 2021-02-13 MED ORDER — SIMETHICONE 80 MG PO CHEW: 80.0000 mg | CHEWABLE_TABLET | ORAL | Status: DC | PRN

## 2021-02-13 MED ORDER — WITCH HAZEL-GLYCERIN EX PADS: 1.0000 "application " | MEDICATED_PAD | CUTANEOUS | Status: DC | PRN

## 2021-02-13 MED ORDER — COCONUT OIL OIL: 1.0000 "application " | TOPICAL_OIL | Status: DC | PRN

## 2021-02-13 MED ORDER — ONDANSETRON HCL 4 MG/2ML IJ SOLN: 4.0000 mg | INTRAMUSCULAR | Status: DC | PRN

## 2021-02-13 MED ORDER — IBUPROFEN 600 MG PO TABS
600.0000 mg | ORAL_TABLET | Freq: Four times a day (QID) | ORAL | Status: DC
  Administered 2021-02-13 – 2021-02-14 (×6): 600 mg via ORAL
  Filled 2021-02-13 (×7): qty 1

## 2021-02-13 MED ORDER — PRENATAL MULTIVITAMIN CH
1.0000 | ORAL_TABLET | Freq: Every day | ORAL | Status: DC
  Administered 2021-02-13 – 2021-02-14 (×2): 1 via ORAL
  Filled 2021-02-13 (×2): qty 1

## 2021-02-13 MED ORDER — ACETAMINOPHEN 325 MG PO TABS
650.0000 mg | ORAL_TABLET | ORAL | Status: DC | PRN
  Administered 2021-02-13 (×2): 650 mg via ORAL
  Filled 2021-02-13 (×2): qty 2

## 2021-02-13 MED ORDER — MEDROXYPROGESTERONE ACETATE 150 MG/ML IM SUSP: 150.0000 mg | INTRAMUSCULAR | Status: DC | PRN

## 2021-02-13 NOTE — Discharge Summary (Shared)
Postpartum Discharge Summary  Date of Service updated***     Patient Name: Vicki Harmon DOB: 2000-12-08 MRN: [id number]  Date of admission: 02/12/2021 Delivery date:02/13/2021  Delivering provider: Renard Matter  Date of discharge: 02/13/2021  Admitting diagnosis: Post-dates pregnancy [O48.0] Intrauterine pregnancy: [redacted]w[redacted]d    Secondary diagnosis:  Active Problems:   Encounter for supervision of normal pregnancy in third trimester   Alpha thalassemia silent carrier   Gestational hypertension, third trimester   Vaginal delivery  Additional problems: ***None    Discharge diagnosis: Term Pregnancy Delivered                                              Post partum procedures:*** Augmentation: Pitocin Complications: None  Hospital course: Onset of Labor With Vaginal Delivery      20y.o. yo G2P1001 at 366w6das admitted in Active Labor on 02/12/2021. Patient had an uncomplicated labor course as follows:  Membrane Rupture Time/Date: 6:15 PM ,02/12/2021   Delivery Method:Vaginal, Spontaneous  Episiotomy: None  Lacerations:  None  Patient had an uncomplicated postpartum course.  She is ambulating, tolerating a regular diet, passing flatus, and urinating well. Patient is discharged home in stable condition on 02/13/21.  Newborn Data: Birth date:02/13/2021  Birth time:1:30 AM  Gender:Female  Living status:Living  Apgars:9 ,9  Weight:   Magnesium Sulfate received: No BMZ received: No Rhophylac:N/A MMR:N/A T-DaP:Given prenatally Flu: given prenatally Transfusion:{Transfusion received:30440034}  Physical exam  Vitals:   02/13/21 0101 02/13/21 0108 02/13/21 0137 02/13/21 0148  BP:  139/90 (!) 121/106 137/78  Pulse:  87 72 92  Resp:      Temp: 97.9 F (36.6 C)     TempSrc: Oral     SpO2:      Weight:      Height:       General: {Exam; general:21111117} Lochia: {Desc; appropriate/inappropriate:30686::"appropriate"} Uterine Fundus: {Desc; firm/soft:30687} Incision: {Exam;  incision:21111123} DVT Evaluation: {Exam; dvt:2111122} Labs: Lab Results  Component Value Date   WBC 10.3 02/12/2021   HGB 10.3 (L) 02/12/2021   HCT 32.9 (L) 02/12/2021   MCV 82.5 02/12/2021   PLT 316 02/12/2021   CMP Latest Ref Rng & Units 02/12/2021  Glucose 70 - 99 mg/dL 95  BUN 6 - 20 mg/dL 5(L)  Creatinine 0.44 - 1.00 mg/dL 0.65  Sodium 135 - 145 mmol/L 135  Potassium 3.5 - 5.1 mmol/L 4.2  Chloride 98 - 111 mmol/L 106  CO2 22 - 32 mmol/L 20(L)  Calcium 8.9 - 10.3 mg/dL 9.3  Total Protein 6.5 - 8.1 g/dL 7.5  Total Bilirubin 0.3 - 1.2 mg/dL 0.4  Alkaline Phos 38 - 126 U/L 166(H)  AST 15 - 41 U/L 22  ALT 0 - 44 U/L 14   Edinburgh Score: Edinburgh Postnatal Depression Scale Screening Tool 07/08/2017  I have been able to laugh and see the funny side of things. 0  I have looked forward with enjoyment to things. 0  I have blamed myself unnecessarily when things went wrong. 0  I have been anxious or worried for no good reason. 0  I have felt scared or panicky for no good reason. 0  Things have been getting on top of me. 0  I have been so unhappy that I have had difficulty sleeping. 0  I have felt sad or miserable. 0  I have been so  unhappy that I have been crying. 0  The thought of harming myself has occurred to me. 0  Edinburgh Postnatal Depression Scale Total 0     After visit meds:  Allergies as of 02/13/2021       Reactions   Shellfish Allergy Swelling     Med Rec must be completed prior to using this [facility]***        Discharge home in stable condition Infant Feeding: {Baby feeding:23562} Infant Disposition:{CHL IP OB HOME WITH EIHDTP:12258} Discharge instruction: per After Visit Summary and Postpartum booklet. Activity: Advance as tolerated. Pelvic rest for 6 weeks.  Diet: routine diet Future Appointments: Future Appointments  Date Time Provider [place]  02/15/2021  9:35 AM Shelly Bombard, MD [facility] None   Follow up Visit: Message  sent to [facility] by Dr. Cy Blamer on 02/13/2021  Please schedule this patient for a In person postpartum visit in 4 weeks with the following provider: Any provider. Additional Postpartum F/U:BP check 1 week  High risk pregnancy complicated by: HTN Delivery mode:  Vaginal, Spontaneous  Anticipated Birth Control:   Plans Depo   02/13/2021 Renard Matter, MD

## 2021-02-13 NOTE — Lactation Note (Signed)
This note was copied from a baby's chart. Lactation Consultation Note  Patient Name: Vicki Harmon [id number] Date: 02/13/2021 Reason for consult: Follow-up assessment;Term Age:22 hours   P2 mother whose infant is now 75 hours old.  This is a term baby at 39+6 weeks.  Mother breast fed her first child (now 55 years old) for a couple of months.    Mother had not called back for a return visit as requested this morning, however, she was very welcoming when I returned at this time.  Baby "Vicki Harmon" was swaddled and asleep when I arrived.  Mother last breast fed approximately 1-1/2 hours ago.  She had some basic breast feeding questions which I answered to her satisfaction.  Reviewed breast feeding concepts and feeding cues.  Suggested she call her RN/LC for latch assistance as needed.  Mother has her own DEBP at bedside and is interested in continuing to pump for stimulation.  She informed me that she had gotten some colostrum out (enough to cover the bottom of her bottle) but she threw it away.  Education completed on the benefits of feeding back any EBM she obtains and discussed milk storage times.  Visitor present.  RN updated.   Maternal Data Has patient been taught Hand Expression?: Yes Does the patient have breastfeeding experience prior to this delivery?: Yes How long did the patient breastfeed?: Couple of months  Feeding Mother's Current Feeding Choice: Breast Milk  LATCH Score                    Lactation Tools Discussed/Used Tools: Pump;Flanges (Mother has her own personal pump and is using it) Reason for Pumping: Mother's preference Pumping frequency: PRN  Interventions Interventions: Breast feeding basics reviewed;Education  Discharge Pump: Personal  Consult Status Consult Status: Follow-up Date: 02/14/21 Follow-up type: In-patient    [employer] R Sharissa Brierley 02/13/2021, 4:12 PM

## 2021-02-13 NOTE — Anesthesia Postprocedure Evaluation (Signed)
Anesthesia Post Note  Patient: Vicki Harmon  Procedure(s) Performed: AN AD HOC LABOR EPIDURAL     Patient location during evaluation: Mother Baby Anesthesia Type: Epidural Level of consciousness: awake and alert and oriented Pain management: satisfactory to patient Vital Signs Assessment: post-procedure vital signs reviewed and stable Respiratory status: respiratory function stable Cardiovascular status: stable Postop Assessment: no headache, no backache, epidural receding, patient able to bend at knees, no signs of nausea or vomiting, adequate PO intake and able to ambulate Anesthetic complications: no   No notable events documented.  Last Vitals:  Vitals:   02/13/21 0900 02/13/21 1400  BP: 124/65 130/80  Pulse: 79 70  Resp: 18 18  Temp: 36.8 C 36.6 C  SpO2:      Last Pain:  Vitals:   02/13/21 1520  TempSrc:   PainSc: 5    Pain Goal:                Epidural/Spinal Function Cutaneous sensation: Normal sensation (02/13/21 1423), Patient able to flex knees: Yes (02/13/21 1423), Patient able to lift hips off bed: Yes (02/13/21 1423), Back pain beyond tenderness at insertion site: No (02/13/21 1423), Progressively worsening motor and/or sensory loss: No (02/13/21 1423), Bowel and/or bladder incontinence post epidural: No (02/13/21 1423)  Gina Leblond

## 2021-02-13 NOTE — Lactation Note (Signed)
This note was copied from a baby's chart. Lactation Consultation Note  Patient Name: Vicki Harmon [id number] Date: 02/13/2021 Reason for consult: Initial assessment;Term Age:22 hours   LC Note:  Initially mother declined lactation services.  Per RN, this morning she decided that she was interested in seeing a Advertising copywriter.  Order placed by RN at [phone].  When I arrived, mother was resting and declined assistance.  Offered to return at her convenience.  RN updated.   Maternal Data    Feeding Mother's Current Feeding Choice: Breast Milk  LATCH Score Latch: Grasps breast easily, tongue down, lips flanged, rhythmical sucking.  Audible Swallowing: Spontaneous and intermittent  Type of Nipple: Everted at rest and after stimulation  Comfort (Breast/Nipple): Soft / non-tender  Hold (Positioning): Assistance needed to correctly position infant at breast and maintain latch.  LATCH Score: 9   Lactation Tools Discussed/Used    Interventions    Discharge    Consult Status Consult Status: Follow-up Date: 02/13/21 Follow-up type: In-patient    Ioanna Colquhoun R Audrey Eller 02/13/2021, 9:59 AM

## 2021-02-14 ENCOUNTER — Other Ambulatory Visit (HOSPITAL_COMMUNITY): Payer: Self-pay

## 2021-02-14 DIAGNOSIS — Z30017 Encounter for initial prescription of implantable subdermal contraceptive: Secondary | ICD-10-CM

## 2021-02-14 MED ORDER — ETONOGESTREL 68 MG ~~LOC~~ IMPL
68.0000 mg | DRUG_IMPLANT | Freq: Once | SUBCUTANEOUS | Status: AC
  Administered 2021-02-14: 68 mg via SUBCUTANEOUS
  Filled 2021-02-14: qty 1

## 2021-02-14 MED ORDER — IBUPROFEN 600 MG PO TABS: 600.0000 mg | ORAL_TABLET | Freq: Four times a day (QID) | ORAL | 0 refills | Status: DC | PRN

## 2021-02-14 MED ORDER — LIDOCAINE HCL 1 % IJ SOLN
0.0000 mL | Freq: Once | INTRAMUSCULAR | Status: AC | PRN
  Administered 2021-02-14: 20 mL via INTRADERMAL
  Filled 2021-02-14: qty 20

## 2021-02-14 MED ORDER — NIFEDIPINE ER OSMOTIC RELEASE 30 MG PO TB24
30.0000 mg | ORAL_TABLET | Freq: Every day | ORAL | Status: DC
  Administered 2021-02-14: 30 mg via ORAL
  Filled 2021-02-14: qty 1

## 2021-02-14 MED ORDER — NIFEDIPINE ER 30 MG PO TB24
30.0000 mg | ORAL_TABLET | Freq: Every day | ORAL | 2 refills | Status: DC
  Filled 2021-02-14: qty 30, 30d supply, fill #0

## 2021-02-14 NOTE — Lactation Note (Signed)
This note was copied from a baby's chart. Lactation Consultation Note  Patient Name: Vicki Harmon [id number] Date: 02/14/2021 Reason for consult: Follow-up assessment Age:22 hours  P2, Mother is giving formula in addition to breastfeeding because she is concerned about her supply. Reviewed supply and demand. Reviewed engorgement care and monitoring voids/stools.    Feeding Mother's Current Feeding Choice: Breast Milk and Formula Interventions Interventions: Breast feeding basics reviewed;Education  Discharge Discharge Education: Engorgement and breast care;Warning signs for feeding baby  Consult Status Consult Status: Follow-up Date: 02/14/21 Follow-up type: In-patient    Dahlia Byes [facility] 02/14/2021, 1:50 PM

## 2021-02-14 NOTE — Procedures (Addendum)
Nexplanon Insertion Procedure Note  Patient was identified. Informed consent was signed, signed copy in chart. A time-out was performed.    The insertion site was identified 8-10 cm (3-4 inches) from the medial epicondyle of the humerus and 3-5 cm (1.25-2 inches) posterior to (below) the sulcus (groove) between the biceps and triceps muscles of the patient's left arm and marked. The site was prepped and draped in the usual sterile fashion. Pt was prepped with iodine swab and then injected with 2 cc of 1% lidocaine.  The site was prepped with betadine. Nexplanon removed form packaging,  Device confirmed in needle, then inserted full length of needle and withdrawn per handbook instructions. Provider and patient verified presence of the implant in the woman's arm by palpation. Pt insertion site was covered with steristrips/adhesive bandage and pressure bandage. There was minimal blood loss. Patient tolerated procedure well.  Patient was given post procedure instructions and Nexplanon user card with expiration date. Condoms were recommended for STI prevention. Patient was asked to keep the pressure dressing on for 24 hours to minimize bruising and keep the adhesive bandage on for 3-5 days. The patient verbalized understanding of the plan of care and agrees.   Lot # [id number] Expiration Date 01/03/2023

## 2021-02-14 NOTE — Progress Notes (Signed)
CSW acknowledged consult and followed up with RN. RN reported that MOB is currently resting and agreed to notify CSW when MOB wakes up.   Celso Sickle, LCSW Clinical Social Worker [facility] Cell#: [phone]

## 2021-02-14 NOTE — Clinical Social Work Maternal (Signed)
CLINICAL SOCIAL WORK MATERNAL/CHILD NOTE  Patient Details  Name: Vicki Harmon MRN: [id number] Date of Birth: 12/14/2000  Date:  02-23-21  Clinical Social Worker Initiating Note:  Celso Sickle, [place] Date/Time: Initiated:  02/14/21/1248     Child's Name:  Vicki Harmon   Biological Parents:  Mother, Father (Father: Vicki Harmon (12/16/1998))   Need for Interpreter:  None   Reason for Referral:  Current Substance Use/Substance Use During Pregnancy     Address:  [address] [place] [address]    Phone number:  [phone] (home)     Additional phone number:   Household Members/Support Persons (HM/SP):   Household Member/Support Person 1, Household Member/Support Person 2   HM/SP Name Relationship DOB or Age  HM/SP -1 Vicki Harmon    HM/SP -2 Vicki Harmon son 05/20/07  HM/SP -3        HM/SP -4        HM/SP -5        HM/SP -6        HM/SP -7        HM/SP -8          Natural Supports (not living in the home):  Spouse/significant other   Professional Supports: None   Employment: Unemployed   Type of Work:     Education:  Engineer, agricultural   Homebound arranged:    Surveyor, quantity Resources:  [employer]   Other Resources:  [employer], Sales executive     Cultural/Religious Considerations Which May Impact Care:    Strengths:  Ability to meet basic needs  , Merchandiser, retail, Home prepared for child     Psychotropic Medications:         Pediatrician:    Armed forces operational officer area  Pediatrician List:   [facility]  [place]    [place]    [place] [facility]      Pediatrician Fax Number:    Risk Factors/Current Problems:  Substance Use     Cognitive State:  Able to Concentrate  , Alert  , Goal Oriented  , Insightful  , Linear Thinking     Mood/Affect:  Calm  , Interested  , Comfortable     CSW Assessment: CSW contacted MOB to complete psychosocial assessment. CSW introduced self and  explained reason for consult. MOB sounded calm, openly shared, and remained engaged during assessment. MOB reported that she resides with her mother and son. MOB reported that she has all items needed to care for infant including a car seat and crib. CSW inquired about MOB's support system, MOB reported that FOB was a support.   CSW inquired about MOB's mental health history. MOB denied any mental health history. MOB denied any history of postpartum depression. CSW inquired about how MOB was feeling emotionally after giving birth, MOB reported that she was feeling okay. MOB sounded calm and did not verbalize any acute mental health signs/symptoms. CSW assessed for safety, MOB denied SI, HI, and domestic violence.   CSW provided education regarding the baby blues period vs. perinatal mood disorders, discussed treatment and gave resources for mental health follow up if concerns arise.  CSW recommends self-evaluation during the postpartum time period using the New Mom Checklist from Postpartum Progress and encouraged MOB to contact a medical professional if symptoms are noted at any time.    CSW provided review of Sudden Infant Death Syndrome (SIDS) precautions.    CSW informed MOB  about the hospital drug screen policy due to documented substance use during pregnancy. MOB reported last use of marijuana in July and denied any additional substance use during pregnancy. CSW informed MOB that infant's UDS was not monitored and CDS would be monitored and a CPS report would be made if warranted. MOB verbalized understanding and denied any CPS history. MOB reported no questions.   CSW identifies no further need for intervention and no barriers to discharge at this time.   CSW Plan/Description:  Sudden Infant Death Syndrome (SIDS) Education, No Further Intervention Required/No Barriers to Discharge, Perinatal Mood and Anxiety Disorder (PMADs) Education, Hospital Drug Screen Policy Information, CSW Will Continue to  Monitor Umbilical Cord Tissue Drug Screen Results and Make Report if Jamelle Rushing, LCSW 02/14/2021, 12:50 PM

## 2021-02-14 NOTE — Discharge Summary (Addendum)
OB Discharge Summary  Patient Name: Vicki Harmon DOB: 06-26-2000 MRN: [id number]  Date of admission: 02/12/2021 Delivering provider: Renard Matter   Admitting diagnosis: Post-dates pregnancy [O48.0] Intrauterine pregnancy: [redacted]w[redacted]d    Secondary diagnosis: Patient Active Problem List   Diagnosis Date Noted   Vaginal delivery 02/13/2021   Gestational hypertension, third trimester 02/12/2021   Alpha thalassemia silent carrier 11/23/2020   Encounter for supervision of normal pregnancy in third trimester 07/14/2020   Headache disorder 10/30/2017     Date of discharge: 02/14/2021   Discharge diagnosis: Active Problems:   Encounter for supervision of normal pregnancy in third trimester   Alpha thalassemia silent carrier   Gestational hypertension, third trimester -Started Procardia on PPD#1   Vaginal delivery                                                              Post partum procedures: Nexplanon placement  Augmentation: Pitocin Pain control: Epidural  Laceration:None  Episiotomy:None  Complications: None    Hospital course: Spontaneous Labor with Vaginal Delivery 20y.o. yo G2P2002 at 371w6das admitted to the hospital 02/12/2021 for spontaneous onset of labor with elevated BP which proceeded to meet criteria for gHTN (asymptomatic, neg labs). Possible SROM in MAU but was intact.  Risks:  Alpha thalassemia carrier  Membrane Rupture Time/Date: 6:15 PM ,02/12/2021   Delivery Method:Vaginal, Spontaneous  Episiotomy: None  Lacerations:  None  Details of delivery can be found in separate delivery note.  Patient had a routine postpartum course with the addition of being started on ProcardiaXL 3041mn PPD#1 due to persistent mild range elevations. She also expressed an interest in Nexplanon placement which was completed prior to her discharge. Patient anticipates being discharged home 02/14/21 as long as the Harmon can go as well.  Newborn Data: Birth date:02/13/2021  Birth time:1:30  AM  Gender:Female  Living status:Living  Apgars:9 ,9  Weight:3600 g   Physical exam  Vitals:   02/13/21 0600 02/13/21 0900 02/13/21 1400 02/14/21 0520  BP: 132/80 124/65 130/80 123/72  Pulse: 79 79 70 67  Resp:  _0 Temp:  98.3 F (36.8 C) 97.9 F (36.6 C) 98 F (36.7 C)  TempSrc:  Oral Oral Oral  SpO2:    100%  Weight:      Height:       General: alert, cooperative, and no distress Lochia: appropriate Uterine Fundus: firm, 1-below Incision: N/A Perineum: repair N/A, mild edema DVT Evaluation: No evidence of DVT seen on physical exam. No significant calf/ankle edema.  Labs: Lab Results  Component Value Date   WBC 13.6 (H) 02/13/2021   HGB 9.4 (L) 02/13/2021   HCT 30.9 (L) 02/13/2021   MCV 82.8 02/13/2021   PLT 272 02/13/2021   CMP Latest Ref Rng & Units 02/12/2021  Glucose 70 - 99 mg/dL 95  BUN 6 - 20 mg/dL 5(L)  Creatinine 0.44 - 1.00 mg/dL 0.65  Sodium 135 - 145 mmol/L 135  Potassium 3.5 - 5.1 mmol/L 4.2  Chloride 98 - 111 mmol/L 106  CO2 22 - 32 mmol/L 20(L)  Calcium 8.9 - 10.3 mg/dL 9.3  Total Protein 6.5 - 8.1 g/dL 7.5  Total Bilirubin 0.3 - 1.2 mg/dL 0.4  Alkaline Phos 38 - 126 U/L 166(H)  AST 15 -  41 U/L 22  ALT 0 - 44 U/L 14   Edinburgh Postnatal Depression Scale Screening Tool 02/13/2021 02/13/2021 07/08/2017 05/19/2017  I have been able to laugh and see the funny side of things. 0 (No Data) 0 0  I have looked forward with enjoyment to things. 0 - 0 0  I have blamed myself unnecessarily when things went wrong. 1 - 0 2  I have been anxious or worried for no good reason. 0 - 0 1  I have felt scared or panicky for no good reason. 1 - 0 0  Things have been getting on top of me. 1 - 0 0  I have been so unhappy that I have had difficulty sleeping. 0 - 0 0  I have felt sad or miserable. 0 - 0 0  I have been so unhappy that I have been crying. 1 - 0 0  The thought of harming myself has occurred to me. 0 - 0 0  Edinburgh Postnatal Depression Scale Total  4 - 0 3    Vaccines: TDaP Declined         Flu    Declined         COVID-19   1 dose  Discharge instructions:  Per postpartum care instruction and postpartum hypertension guidelines.   After Visit Meds:  Allergies as of 02/14/2021       Reactions   Shellfish Allergy Swelling        Medication List     STOP taking these medications    Blood Pressure Kit Devi   Iron Polysacch Cmplx-B12-FA 150-0.025-1 MG Caps       TAKE these medications    ibuprofen 600 MG tablet Commonly known as: ADVIL Take 1 tablet (600 mg total) by mouth every 6 (six) hours as needed.   NIFEdipine 30 MG 24 hr tablet Commonly known as: ADALAT CC Take 1 tablet (30 mg total) by mouth daily.   Vitafol Gummies 3.33-0.333-34.8 MG Chew Chew 3 tablets by mouth daily before breakfast.           Diet: Routine diet  Activity: Advance as tolerated. Pelvic rest for 6 weeks.   Newborn Data: Live born female  Birth Weight: 7 lb 15 oz (3600 g) APGAR: 7, 9  Newborn Delivery   Birth date/time: 02/13/2021 01:30:00 Delivery type: Vaginal, Spontaneous       Named: Vicki Harmon Feeding: Breast Disposition:home with mother Nexplanon prior to d/c Circ prior to d/c   Follow up: Message sent to [facility] by Dr. Cy Blamer on 02/13/2021   Please schedule this patient for a In person postpartum visit in 4 weeks with the following provider: Any provider. Additional Postpartum F/U:BP check 1 week  High risk pregnancy complicated by: HTN Delivery mode:  Vaginal, Spontaneous  Anticipated Birth Control:   Plans Depo   Vicki Harmon 02/14/2021, 11:54 AM

## 2021-02-15 ENCOUNTER — Encounter: Payer: Medicaid Other | Admitting: Obstetrics

## 2021-02-15 ENCOUNTER — Telehealth: Payer: Self-pay

## 2021-02-15 NOTE — Telephone Encounter (Signed)
Transition Care Management Unsuccessful Follow-up Telephone Call  Date of discharge and from where:  02/14/2021-[facility]  Attempts:  1st Attempt  Reason for unsuccessful TCM follow-up call:  Unable to reach patient

## 2021-02-16 NOTE — Telephone Encounter (Signed)
Transition Care Management Unsuccessful Follow-up Telephone Call  Date of discharge and from where:  02/14/2021-[facility]  Attempts:  2nd Attempt  Reason for unsuccessful TCM follow-up call:  Unable to reach patient

## 2021-02-17 NOTE — Telephone Encounter (Signed)
Transition Care Management Unsuccessful Follow-up Telephone Call  Date of discharge and from where:  02/14/2021-[facility]  Attempts:  3rd Attempt  Reason for unsuccessful TCM follow-up call:  Unable to reach patient

## 2021-02-20 ENCOUNTER — Other Ambulatory Visit: Payer: Self-pay

## 2021-02-20 ENCOUNTER — Ambulatory Visit: Payer: Medicaid Other | Admitting: *Deleted

## 2021-02-20 NOTE — Progress Notes (Signed)
Patient was assessed and managed by nursing staff during this encounter. I have reviewed the chart and agree with the documentation and plan. I have also made any necessary editorial changes.  Catalina Antigua, MD 02/20/2021 4:43 PM

## 2021-02-20 NOTE — Progress Notes (Signed)
Subjective:  Vicki Harmon is a 20 y.o. female here for BP check.   Hypertension ROS: taking medications as instructed, no medication side effects noted, no TIA's, no chest pain on exertion, no dyspnea on exertion, and no swelling of ankles.    Objective:  BP 130/78   Appearance alert, well appearing, and in no distress. General exam BP noted to be well controlled today in office.    Assessment:   Blood Pressure well controlled.   Plan:  Current treatment plan is effective, no change in therapy.Marland Kitchen

## 2021-02-21 ENCOUNTER — Inpatient Hospital Stay (HOSPITAL_COMMUNITY): Payer: Medicaid Other

## 2021-02-21 ENCOUNTER — Inpatient Hospital Stay (HOSPITAL_COMMUNITY)
Admission: AD | Admit: 2021-02-21 | Payer: Medicaid Other | Source: Ambulatory Visit | Admitting: Obstetrics & Gynecology

## 2021-02-28 ENCOUNTER — Telehealth (HOSPITAL_COMMUNITY): Payer: Self-pay | Admitting: *Deleted

## 2021-02-28 NOTE — Telephone Encounter (Signed)
Hospital Discharge Follow-Up Call:  Patient reports that she is doing well and has no concerns about her healing process.  EPDS today is 2 and she endorses this accurately reflects that she is doing well emotionally.   Patient says that baby is well and she has no concerns about baby's health.  She had questions about breastfeeding and the use of formula when she returns to work next month.  We discussed options and how she may best meet her infant feeding goals.  Encouraged patient to utilize lactation peer support and LC support at [facility] office as she is active with that service.  She reports that baby sleeps in a PlayPack.  ABCs of Safe Sleep reviewed.

## 2021-03-20 ENCOUNTER — Ambulatory Visit: Payer: Medicaid Other | Admitting: Advanced Practice Midwife

## 2021-07-18 ENCOUNTER — Encounter (HOSPITAL_COMMUNITY): Payer: Self-pay | Admitting: Registered Nurse

## 2021-07-18 ENCOUNTER — Ambulatory Visit (HOSPITAL_COMMUNITY)
Admission: EM | Admit: 2021-07-18 | Discharge: 2021-07-18 | Disposition: A | Discharge status: Home / Self Care | Payer: Medicaid Other | Attending: Registered Nurse | Admitting: Registered Nurse

## 2021-07-18 DIAGNOSIS — Z046 Encounter for general psychiatric examination, requested by authority: Secondary | ICD-10-CM

## 2021-07-18 DIAGNOSIS — Z638 Other specified problems related to primary support group: Secondary | ICD-10-CM

## 2021-07-18 DIAGNOSIS — Z635 Disruption of family by separation and divorce: Secondary | ICD-10-CM | POA: Insufficient documentation

## 2021-07-18 DIAGNOSIS — R45851 Suicidal ideations: Secondary | ICD-10-CM | POA: Insufficient documentation

## 2021-07-18 DIAGNOSIS — Z560 Unemployment, unspecified: Secondary | ICD-10-CM | POA: Insufficient documentation
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
## 2021-07-18 NOTE — Discharge Instructions (Addendum)
Follow up with your primary doctor related to elevated blood pressure.  If symptoms occur go to emergency room.  Information on high blood pressure included with discharge instructions ?
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
## 2021-09-06 ENCOUNTER — Ambulatory Visit (HOSPITAL_COMMUNITY)
Admission: EM | Admit: 2021-09-06 | Discharge: 2021-09-06 | Disposition: A | Discharge status: Home / Self Care | Payer: Medicaid Other | Attending: Internal Medicine | Admitting: Internal Medicine

## 2021-09-06 ENCOUNTER — Encounter (HOSPITAL_COMMUNITY): Payer: Self-pay

## 2021-09-06 ENCOUNTER — Ambulatory Visit (INDEPENDENT_AMBULATORY_CARE_PROVIDER_SITE_OTHER): Payer: Medicaid Other

## 2021-09-06 DIAGNOSIS — S9031XA Contusion of right foot, initial encounter: Secondary | ICD-10-CM

## 2021-09-06 DIAGNOSIS — M79671 Pain in right foot: Secondary | ICD-10-CM

## 2021-09-06 DIAGNOSIS — S93601A Unspecified sprain of right foot, initial encounter: Secondary | ICD-10-CM | POA: Diagnosis not present

## 2021-09-06 MED ORDER — IBUPROFEN 600 MG PO TABS: 600.0000 mg | ORAL_TABLET | Freq: Four times a day (QID) | ORAL | 0 refills | Status: DC | PRN

## 2021-09-06 NOTE — ED Provider Notes (Signed)
[facility]    CSN: [id number] Arrival date & time: 09/06/21  1251      History   Chief Complaint Chief Complaint  Patient presents with   Foot Injury    HPI Vicki Harmon is a 21 y.o. female.   21 year old female presents with right foot pain.  Patient relates that last week when she is at work she had a pallet jack ran over her right foot.  Patient relates that she was wearing steel toed shoes at the time, and had minimal pain initially.  Patient relates over the past week she has been icing the area, but continues to have pain located on the dorsum of the foot and the second and third metatarsal areas.  Patient relates she is getting pain when she walks, it is causing her to limp occasionally.  Patient is taking Tylenol alternating with ibuprofen and is getting minimal relief.  Patient is also icing the area on a regular basis.  Patient comes in today and she is concerned about the continued pain in the foot.   Foot Injury   Past Medical History:  Diagnosis Date   Chlamydia     Patient Active Problem List   Diagnosis Date Noted   Family discord 07/18/2021   Involuntary commitment 07/18/2021   Vaginal delivery 02/13/2021   Gestational hypertension, third trimester 02/12/2021   Alpha thalassemia silent carrier 11/23/2020   Encounter for supervision of normal pregnancy in third trimester 07/14/2020   Headache disorder 10/30/2017    Past Surgical History:  Procedure Laterality Date   NO PAST SURGERIES      OB History     Gravida  2   Para  2   Term  2   Preterm      AB      Living  2      SAB      IAB      Ectopic      Multiple  0   Live Births  2            Home Medications    Prior to Admission medications   Medication Sig Start Date End Date Taking? Authorizing Provider  ibuprofen (ADVIL) 600 MG tablet Take 1 tablet (600 mg total) by mouth every 6 (six) hours as needed. 09/06/21   Ellsworth Lennox, PA-C  NIFEdipine (ADALAT CC)  30 MG 24 hr tablet Take 1 tablet (30 mg total) by mouth daily. 02/14/21   Arabella Merles, CNM    Family History Family History  Problem Relation Age of Onset   Hyperlipidemia Mother    Hypertension Mother     Social History Social History   Tobacco Use   Smoking status: Never   Smokeless tobacco: Never  Vaping Use   Vaping Use: Never used  Substance Use Topics   Alcohol use: Not Currently    Comment: not since confirmed pregnancy   Drug use: Not Currently    Types: Marijuana    Comment: last used 2 days ago     Allergies   Shellfish allergy   Review of Systems Review of Systems  Musculoskeletal:  Positive for joint swelling (Right foot pain).     Physical Exam Triage Vital Signs ED Triage Vitals [09/06/21 1347]  Enc Vitals Group     BP 128/79     Pulse Rate 79     Resp 18     Temp 98.2 F (36.8 C)     Temp Source Oral  SpO2 98 %     Weight      Height      Head Circumference      Peak Flow      Pain Score 7     Pain Loc      Pain Edu?      Excl. in GC?    No data found.  Updated Vital Signs BP 128/79 (BP Location: Left Arm)   Pulse 79   Temp 98.2 F (36.8 C) (Oral)   Resp 18   SpO2 98%   Breastfeeding Yes   Visual Acuity Right Eye Distance:   Left Eye Distance:   Bilateral Distance:    Right Eye Near:   Left Eye Near:    Bilateral Near:     Physical Exam Constitutional:      Appearance: Normal appearance.  Musculoskeletal:     Comments: Right foot: Pain is palpated in the first second and third mid metatarsal dorsum area.  Range of motion is limited on flexion of the first second and third digits.  No swelling, no redness.  Is normal  Neurological:     Mental Status: She is alert.      UC Treatments / Results  Labs (all labs ordered are listed, but only abnormal results are displayed) Labs Reviewed - No data to display  EKG   Radiology DG Foot Complete Right  Result Date: 09/06/2021 CLINICAL DATA:  Persistent right  foot pain since work injury 2 weeks ago. EXAM: RIGHT FOOT COMPLETE - 3+ VIEW COMPARISON:  Right ankle x-rays dated June 12, 2012. FINDINGS: There is no evidence of fracture or dislocation. There is no evidence of arthropathy or other focal bone abnormality. Soft tissues are unremarkable. IMPRESSION: Negative. Electronically Signed   By: Obie Dredge M.D.   On: 09/06/2021 14:10    Procedures Procedures (including critical care time)  Medications Ordered in UC Medications - No data to display  Initial Impression / Assessment and Plan / UC Course  I have reviewed the triage vital signs and the nursing notes.  Pertinent labs & imaging results that were available during my care of the patient were reviewed by me and considered in my medical decision making (see chart for details).    Plan: 1.  Advised to continue ice therapy, 10 minutes on 20 minutes off, 4-5 times throughout the day. 2.  Advised to take ibuprofen 600 mg every 8 hours on a regular basis to help with the pain. 3.  Advised good support in chills for work shoes. 4.  Advised to follow-up with PCP or return to urgent care if symptoms fail to improve within the next 10 days. Final Clinical Impressions(s) / UC Diagnoses   Final diagnoses:  Contusion of right foot, initial encounter  Sprain of right foot, initial encounter     Discharge Instructions      Advised continue ice therapy, 10 minutes on 20 minutes off, 4-5 times a day. Advised to use ibuprofen 600 mg 1 every 8 hours with food to help reduce the pain. Advised to make sure that she have good cushioned insoles to help cushion the foot. Advised to follow-up with PCP or return to urgent care if symptoms fail to improve.    ED Prescriptions     Medication Sig Dispense Auth. Provider   ibuprofen (ADVIL) 600 MG tablet Take 1 tablet (600 mg total) by mouth every 6 (six) hours as needed. 30 tablet Ellsworth Lennox, PA-C      PDMP  not reviewed this encounter.   Nyoka Lint, PA-C 09/06/21 1423

## 2021-09-06 NOTE — ED Triage Notes (Signed)
Pt states ran over her rt foot with a pallet jack at work 2 wks ago. States icing and using OTC meds with little relief.

## 2021-09-06 NOTE — Discharge Instructions (Addendum)
Advised continue ice therapy, 10 minutes on 20 minutes off, 4-5 times a day. Advised to use ibuprofen 600 mg 1 every 8 hours with food to help reduce the pain. Advised to make sure that she have good cushioned insoles to help cushion the foot. Advised to follow-up with PCP or return to urgent care if symptoms fail to improve.

## 2021-09-17 ENCOUNTER — Ambulatory Visit (HOSPITAL_COMMUNITY)
Admission: EM | Admit: 2021-09-17 | Discharge: 2021-09-17 | Disposition: A | Discharge status: Home / Self Care | Payer: Medicaid Other | Attending: Emergency Medicine | Admitting: Emergency Medicine

## 2021-09-17 ENCOUNTER — Encounter (HOSPITAL_COMMUNITY): Payer: Self-pay | Admitting: Emergency Medicine

## 2021-09-17 DIAGNOSIS — Z202 Contact with and (suspected) exposure to infections with a predominantly sexual mode of transmission: Secondary | ICD-10-CM | POA: Diagnosis not present

## 2021-09-17 DIAGNOSIS — Z113 Encounter for screening for infections with a predominantly sexual mode of transmission: Secondary | ICD-10-CM

## 2021-09-17 LAB — HIV ANTIBODY (ROUTINE TESTING W REFLEX): HIV Screen 4th Generation wRfx: NONREACTIVE

## 2021-09-17 MED ORDER — AMOXICILLIN 500 MG PO CAPS: 500.0000 mg | ORAL_CAPSULE | Freq: Two times a day (BID) | ORAL | 0 refills | Status: AC

## 2021-09-17 NOTE — Discharge Instructions (Addendum)
Today you are being treated prophylactically for chlamydia  Begin use of amoxicillin every morning and every evening for 7 days, do not have sex until all medication has been completed   Labs pending 2-3 days, you will be contacted if positive for any sti and treatment will be sent to the pharmacy, you will have to return to the clinic if positive for gonorrhea to receive treatment   Please refrain from having sex until labs results, if positive please refrain from having sex until treatment complete and symptoms resolve   If positive for HIV, Syphilis, Chlamydia  gonorrhea or trichomoniasis please notify partner or partners so they may tested as well  Moving forward, it is recommended you use some form of protection against the transmission of sti infections  such as condoms or dental dams with each sexual encounter

## 2021-09-17 NOTE — ED Provider Notes (Signed)
[facility]    CSN: [id number] Arrival date & time: 09/17/21  1033      History   Chief Complaint Chief Complaint  Patient presents with   Exposure to STD    HPI Vicki Harmon is a 21 y.o. female.   Patient presents requesting STI testing after known exposure to chlamydia.  Denies all symptoms.  Sexually active, 1 partner, no condom use.  Past Medical History:  Diagnosis Date   Chlamydia     Patient Active Problem List   Diagnosis Date Noted   Family discord 07/18/2021   Involuntary commitment 07/18/2021   Vaginal delivery 02/13/2021   Gestational hypertension, third trimester 02/12/2021   Alpha thalassemia silent carrier 11/23/2020   Encounter for supervision of normal pregnancy in third trimester 07/14/2020   Headache disorder 10/30/2017    Past Surgical History:  Procedure Laterality Date   NO PAST SURGERIES      OB History     Gravida  2   Para  2   Term  2   Preterm      AB      Living  2      SAB      IAB      Ectopic      Multiple  0   Live Births  2            Home Medications    Prior to Admission medications   Medication Sig Start Date End Date Taking? Authorizing Provider  amoxicillin (AMOXIL) 500 MG capsule Take 1 capsule (500 mg total) by mouth 2 (two) times daily for 7 days. 09/17/21 09/24/21 Yes Massie Cogliano R, NP  ibuprofen (ADVIL) 600 MG tablet Take 1 tablet (600 mg total) by mouth every 6 (six) hours as needed. 09/06/21   Ellsworth Lennox, PA-C  NIFEdipine (ADALAT CC) 30 MG 24 hr tablet Take 1 tablet (30 mg total) by mouth daily. 02/14/21   Arabella Merles, CNM    Family History Family History  Problem Relation Age of Onset   Hyperlipidemia Mother    Hypertension Mother     Social History Social History   Tobacco Use   Smoking status: Never   Smokeless tobacco: Never  Vaping Use   Vaping Use: Never used  Substance Use Topics   Alcohol use: Not Currently    Comment: not since confirmed pregnancy    Drug use: Not Currently    Types: Marijuana    Comment: last used 2 days ago     Allergies   Shellfish allergy   Review of Systems Review of Systems  Constitutional: Negative.   Respiratory: Negative.    Cardiovascular: Negative.   Genitourinary: Negative.      Physical Exam Triage Vital Signs ED Triage Vitals  Enc Vitals Group     BP 09/17/21 1049 104/66     Pulse Rate 09/17/21 1049 100     Resp 09/17/21 1049 17     Temp 09/17/21 1049 98.6 F (37 C)     Temp Source 09/17/21 1049 Oral     SpO2 09/17/21 1048 96 %     Weight --      Height --      Head Circumference --      Peak Flow --      Pain Score 09/17/21 1047 0     Pain Loc --      Pain Edu? --      Excl. in GC? --  No data found.  Updated Vital Signs BP 104/66 (BP Location: Right Arm)   Pulse 100   Temp 98.6 F (37 C) (Oral)   Resp 17   SpO2 96%   Visual Acuity Right Eye Distance:   Left Eye Distance:   Bilateral Distance:    Right Eye Near:   Left Eye Near:    Bilateral Near:     Physical Exam Constitutional:      Appearance: Normal appearance.  HENT:     Head: Normocephalic.  Eyes:     Extraocular Movements: Extraocular movements intact.  Pulmonary:     Effort: Pulmonary effort is normal.  Genitourinary:    Comments: Deferred Neurological:     Mental Status: She is alert and oriented to person, place, and time. Mental status is at baseline.  Psychiatric:        Mood and Affect: Mood normal.        Behavior: Behavior normal.      UC Treatments / Results  Labs (all labs ordered are listed, but only abnormal results are displayed) Labs Reviewed  HIV ANTIBODY (ROUTINE TESTING W REFLEX)  RPR  CERVICOVAGINAL ANCILLARY ONLY    EKG   Radiology No results found.  Procedures Procedures (including critical care time)  Medications Ordered in UC Medications - No data to display  Initial Impression / Assessment and Plan / UC Course  I have reviewed the triage vital  signs and the nursing notes.  Pertinent labs & imaging results that were available during my care of the patient were reviewed by me and considered in my medical decision making (see chart for details).  Routine screening for STI, exposure to chlamydia  We will prophylactically treat due to exposure, patient is currently breast-feeding, will avoid use of tetracycline, amoxicillin 7-day course prescribed for treatment, STI labs pending, will treat further per protocol, advised abstinence until all lab results and treatment is complete, advised condom use during all encounters moving forward, may follow-up with this urgent care as needed Final Clinical Impressions(s) / UC Diagnoses   Final diagnoses:  Routine screening for STI (sexually transmitted infection)  Exposure to chlamydia     Discharge Instructions      Today you are being treated prophylactically for chlamydia  Begin use of amoxicillin every morning and every evening for 7 days, do not have sex until all medication has been completed   Labs pending 2-3 days, you will be contacted if positive for any sti and treatment will be sent to the pharmacy, you will have to return to the clinic if positive for gonorrhea to receive treatment   Please refrain from having sex until labs results, if positive please refrain from having sex until treatment complete and symptoms resolve   If positive for HIV, Syphilis, Chlamydia  gonorrhea or trichomoniasis please notify partner or partners so they may tested as well  Moving forward, it is recommended you use some form of protection against the transmission of sti infections  such as condoms or dental dams with each sexual encounter     ED Prescriptions     Medication Sig Dispense Auth. Provider   amoxicillin (AMOXIL) 500 MG capsule Take 1 capsule (500 mg total) by mouth 2 (two) times daily for 7 days. 14 capsule Torri Langston, Elita Boone, NP      PDMP not reviewed this encounter.   Valinda Hoar, [place] 09/17/21 [phone]

## 2021-09-17 NOTE — ED Triage Notes (Signed)
Pt presents for routine STI check w/ bloodwork. Denies any current symptoms at this time.

## 2021-09-18 LAB — CERVICOVAGINAL ANCILLARY ONLY
Bacterial Vaginitis (gardnerella): NEGATIVE
Candida Glabrata: NEGATIVE
Candida Vaginitis: NEGATIVE
Chlamydia: POSITIVE — AB
Comment: NEGATIVE
Comment: NEGATIVE
Comment: NEGATIVE
Comment: NEGATIVE
Comment: NEGATIVE
Comment: NORMAL
Neisseria Gonorrhea: NEGATIVE
Trichomonas: NEGATIVE

## 2021-09-18 LAB — RPR: RPR Ser Ql: NONREACTIVE

## 2021-11-20 ENCOUNTER — Encounter: Payer: Self-pay | Admitting: Emergency Medicine

## 2021-11-20 ENCOUNTER — Ambulatory Visit
Admission: EM | Admit: 2021-11-20 | Discharge: 2021-11-20 | Disposition: A | Discharge status: Home / Self Care | Payer: Medicaid Other | Attending: Emergency Medicine | Admitting: Emergency Medicine

## 2021-11-20 DIAGNOSIS — Z1152 Encounter for screening for COVID-19: Secondary | ICD-10-CM | POA: Insufficient documentation

## 2021-11-20 DIAGNOSIS — Z20822 Contact with and (suspected) exposure to covid-19: Secondary | ICD-10-CM | POA: Insufficient documentation

## 2021-11-20 NOTE — ED Triage Notes (Signed)
Pt here with COVID exposure this weekend. No sxs.

## 2021-11-20 NOTE — ED Provider Notes (Signed)
HPI  SUBJECTIVE:  Vicki Harmon is a 21 y.o. female who presents with a close exposure to COVID at a family reunion over the weekend. She would like a COVID test. She is currently asymptomatic.She has had one dose of the COVID vaccine.  She has a past medical history of HTN. LMP: this week. Denies possibility of being pregnant.     Past Medical History:  Diagnosis Date   Chlamydia     Past Surgical History:  Procedure Laterality Date   NO PAST SURGERIES      Family History  Problem Relation Age of Onset   Hyperlipidemia Mother    Hypertension Mother     Social History   Tobacco Use   Smoking status: Never   Smokeless tobacco: Never  Vaping Use   Vaping Use: Never used  Substance Use Topics   Alcohol use: Not Currently    Comment: not since confirmed pregnancy   Drug use: Not Currently    Types: Marijuana    Comment: last used 2 days ago    No current facility-administered medications for this encounter.  Current Outpatient Medications:    ibuprofen (ADVIL) 600 MG tablet, Take 1 tablet (600 mg total) by mouth every 6 (six) hours as needed., Disp: 30 tablet, Rfl: 0   NIFEdipine (ADALAT CC) 30 MG 24 hr tablet, Take 1 tablet (30 mg total) by mouth daily., Disp: 30 tablet, Rfl: 2  Allergies  Allergen Reactions   Shellfish Allergy Swelling     ROS  As noted in HPI.   Physical Exam  BP 120/80   Pulse 84   Temp 98.6 F (37 C)   Resp 18   SpO2 98%   Breastfeeding Yes   Constitutional: Well developed, well nourished, no acute distress Eyes:  EOMI, conjunctiva normal bilaterally HENT: Normocephalic, atraumatic,mucus membranes moist Neck: No cervical lymphadenopathy Respiratory: Normal inspiratory effort, lungs clear bilaterally Cardiovascular: Normal rate, regular rhythm, no murmurs, rubs, gallops. GI: nondistended skin: No rash, skin intact Musculoskeletal: no deformities Neurologic: Alert & oriented x 3, no focal neuro deficits Psychiatric: Speech and  behavior appropriate   ED Course   Medications - No data to display  Orders Placed This Encounter  Procedures   SARS CORONAVIRUS 2 (TAT 6-24 HRS) Anterior Nasal Swab    Standing Status:   Standing    Number of Occurrences:   1    Results for orders placed or performed during the hospital encounter of 11/20/21 (from the past 24 hour(s))  SARS CORONAVIRUS 2 (TAT 6-24 HRS) Anterior Nasal Swab     Status: None   Collection Time: 11/20/21 12:45 PM   Specimen: Anterior Nasal Swab  Result Value Ref Range   SARS Coronavirus 2 NEGATIVE NEGATIVE   No results found.  ED Clinical Impression  1. Close exposure to COVID-19 virus   2. Encounter for screening for COVID-19      ED Assessment/Plan     Patient with close contact to COVID-19.  She is at high risk for progression to severe illness because she is partially vaccinated and has a history of hypertension.  Will prescribe molnupiravir if COVID is positive.  COVID negative.   Follow-up with her PCP as needed.  No orders of the defined types were placed in this encounter.     *This clinic note was created using Dragon dictation software. Therefore, there may be occasional mistakes despite careful proofreading.  ?    Domenick Gong, MD 11/21/21 [phone]

## 2021-11-20 NOTE — Discharge Instructions (Addendum)
We will prescribe molnupiravir if your COVID is positive.  We will know  your results in 24 hours or less

## 2021-11-21 LAB — SARS CORONAVIRUS 2 (TAT 6-24 HRS): SARS Coronavirus 2: NEGATIVE

## 2022-05-06 ENCOUNTER — Encounter (HOSPITAL_COMMUNITY): Payer: Self-pay

## 2022-05-06 ENCOUNTER — Emergency Department (HOSPITAL_COMMUNITY): Payer: Medicaid Other

## 2022-05-06 ENCOUNTER — Emergency Department (HOSPITAL_COMMUNITY)
Admission: EM | Admit: 2022-05-06 | Discharge: 2022-05-06 | Disposition: A | Discharge status: Home / Self Care | Payer: Medicaid Other | Attending: Emergency Medicine | Admitting: Emergency Medicine

## 2022-05-06 ENCOUNTER — Other Ambulatory Visit: Payer: Self-pay

## 2022-05-06 DIAGNOSIS — M25571 Pain in right ankle and joints of right foot: Secondary | ICD-10-CM | POA: Insufficient documentation

## 2022-05-06 DIAGNOSIS — S99911A Unspecified injury of right ankle, initial encounter: Secondary | ICD-10-CM | POA: Diagnosis present

## 2022-05-06 DIAGNOSIS — X501XXA Overexertion from prolonged static or awkward postures, initial encounter: Secondary | ICD-10-CM | POA: Insufficient documentation

## 2022-05-06 DIAGNOSIS — Y9302 Activity, running: Secondary | ICD-10-CM | POA: Diagnosis not present

## 2022-05-06 DIAGNOSIS — S8261XA Displaced fracture of lateral malleolus of right fibula, initial encounter for closed fracture: Secondary | ICD-10-CM | POA: Insufficient documentation

## 2022-05-06 DIAGNOSIS — Y9289 Other specified places as the place of occurrence of the external cause: Secondary | ICD-10-CM | POA: Diagnosis not present

## 2022-05-06 MED ORDER — OXYCODONE HCL 5 MG PO TABS: 5.0000 mg | ORAL_TABLET | Freq: Four times a day (QID) | ORAL | 0 refills | Status: DC | PRN

## 2022-05-06 MED ORDER — OXYCODONE-ACETAMINOPHEN 5-325 MG PO TABS
2.0000 | ORAL_TABLET | Freq: Once | ORAL | Status: AC
  Administered 2022-05-06: 2 via ORAL
  Filled 2022-05-06: qty 2

## 2022-05-06 MED ORDER — IBUPROFEN 600 MG PO TABS
600.0000 mg | ORAL_TABLET | Freq: Three times a day (TID) | ORAL | 0 refills | Status: AC | PRN
Start: 2022-05-06 — End: 2022-05-11

## 2022-05-06 MED ORDER — OXYCODONE HCL 5 MG PO TABS: 5.0000 mg | ORAL_TABLET | Freq: Four times a day (QID) | ORAL | 0 refills | Status: AC | PRN

## 2022-05-06 NOTE — Progress Notes (Signed)
Orthopedic Tech Progress Note Patient Details:  Vicki Harmon 2001-02-23 [id number]  Ortho Devices Type of Ortho Device: Crutches, CAM walker Ortho Device/Splint Location: RLE Ortho Device/Splint Interventions: Application, Adjustment   Post Interventions Patient Tolerated: Well, Ambulated well Instructions Provided: Adjustment of device, Poper ambulation with device  Lautaro Koral E [name] 05/06/2022, 12:19 PM
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
## 2022-05-06 NOTE — Discharge Instructions (Addendum)
Thank you for letting us take care of you today.  Due to your injury, you sustained a fracture to the outside of your right ankle.  We gave you pain medication in the ED to help with your pain.  You have good pulses and good sensation in that leg but will still need to follow-up outpatient with orthopedics (the bone doctors) to make sure that this fracture heals appropriately and does not require any further intervention.  We gave you a walking boot to use as you await this follow-up.  You may bear weight on your right leg using this boot.  We also provided you with crutches.  Please use these only if absolutely needed and avoid using the crutches frequently to help you walk. If you find yourself frequently requiring the crutches, you should contact the orthopedic office to discuss need for earlier appointment.  I prescribed a small amount of pain medication for you to take at home as needed.  Please take over-the-counter Tylenol and/or the ibuprofen as prescribed for mild to moderate pain. You may take the oxycodone prescribed for severe pain.  Please take this only as prescribed and do not take these medications with alcohol.   I provided the information for orthopedics.  Please call them tomorrow to schedule a follow-up appointment.  Tell them that you were seen in the emergency department and have a fracture to the lateral malleolus of the right ankle.  They will schedule your follow-up appropriately.  If you develop any worsening symptoms such as severe pain uncontrolled with the medications provided, numbness/tingling of your leg or foot, weakness in your leg or foot, or new symptoms other concerns, please be reevaluated in the nearest emergency department.
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
## 2022-05-17 ENCOUNTER — Ambulatory Visit (INDEPENDENT_AMBULATORY_CARE_PROVIDER_SITE_OTHER): Payer: Medicaid Other | Admitting: Orthopaedic Surgery

## 2022-05-17 DIAGNOSIS — S82831D Other fracture of upper and lower end of right fibula, subsequent encounter for closed fracture with routine healing: Secondary | ICD-10-CM | POA: Diagnosis not present

## 2022-05-17 NOTE — Progress Notes (Signed)
Chief Complaint: Right ankle pain     History of Present Illness:    Vicki Harmon is a 22 y.o. female presents today after she tripped and fell on the right ankle.  She presented to the emergency room on 25 February and was found to have a nondisplaced fibular fracture.  She is here today for further assessment.  She has been in a cam boot since this time.    Surgical History:   none  PMH/PSH/Family History/Social History/Meds/Allergies:    Past Medical History:  Diagnosis Date   Chlamydia    Past Surgical History:  Procedure Laterality Date   NO PAST SURGERIES     Social History   Socioeconomic History   Marital status: Single    Spouse name: Not on file   Number of children: Not on file   Years of education: Not on file   Highest education level: Not on file  Occupational History   Not on file  Tobacco Use   Smoking status: Never   Smokeless tobacco: Never  Vaping Use   Vaping Use: Never used  Substance and Sexual Activity   Alcohol use: Yes    Comment: occ   Drug use: Not Currently    Types: Marijuana    Comment: last used 2 days ago   Sexual activity: Yes    Partners: Male    Birth control/protection: Implant  Other Topics Concern   Not on file  Social History Narrative   Not on file   Social Determinants of Health   Financial Resource Strain: Not on file  Food Insecurity: Not on file  Transportation Needs: Not on file  Physical Activity: Not on file  Stress: Not on file  Social Connections: Not on file   Family History  Problem Relation Age of Onset   Hyperlipidemia Mother    Hypertension Mother    Allergies  Allergen Reactions   Shellfish Allergy Swelling   Current Outpatient Medications  Medication Sig Dispense Refill   NIFEdipine (ADALAT CC) 30 MG 24 hr tablet Take 1 tablet (30 mg total) by mouth daily. 30 tablet 2   No current facility-administered medications for this visit.   No results  found.  Review of Systems:   A ROS was performed including pertinent positives and negatives as documented in the HPI.  Physical Exam :   Constitutional: NAD and appears stated age Neurological: Alert and oriented Psych: Appropriate affect and cooperative currently breastfeeding.   Comprehensive Musculoskeletal Exam:    Right ankle with tenderness to palpation about the distal fibula.  There is no effusion.  No deformity.  Full painless range of motion of the ankle.  Distal neurosensory exam is intact  Imaging:   Xray (3 views right ankle): Nondisplaced Weber a fibula fracture    I personally reviewed and interpreted the radiographs.   Assessment:   22 y.o. female with a nondisplaced Weber a fibula fracture.  At this time I do believe she can be weightbearing as tolerated.  She may wean out of her cam boot as tolerated.  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  She may walk although she may require breaks for longer  distances.  I will plan to see her back in 4 weeks for reassessment  Plan :    -Return to clinic in 4 weeks for reassessment     I personally saw and evaluated the patient, and participated in the management and treatment plan.  Vanetta Mulders, MD Attending Physician, Orthopedic Surgery  This document was dictated using Dragon voice recognition software. A reasonable attempt at proof reading has been made to minimize errors.

## 2022-06-20 ENCOUNTER — Ambulatory Visit (HOSPITAL_BASED_OUTPATIENT_CLINIC_OR_DEPARTMENT_OTHER): Payer: Medicaid Other | Admitting: Orthopaedic Surgery

## 2023-01-03 IMAGING — US US OB LIMITED
1 series · 5 of 5 positions shown · non-contrast
Comparison: none

[Series 1: us ob limited · 5 of 5 slices shown]
[im 1/5]
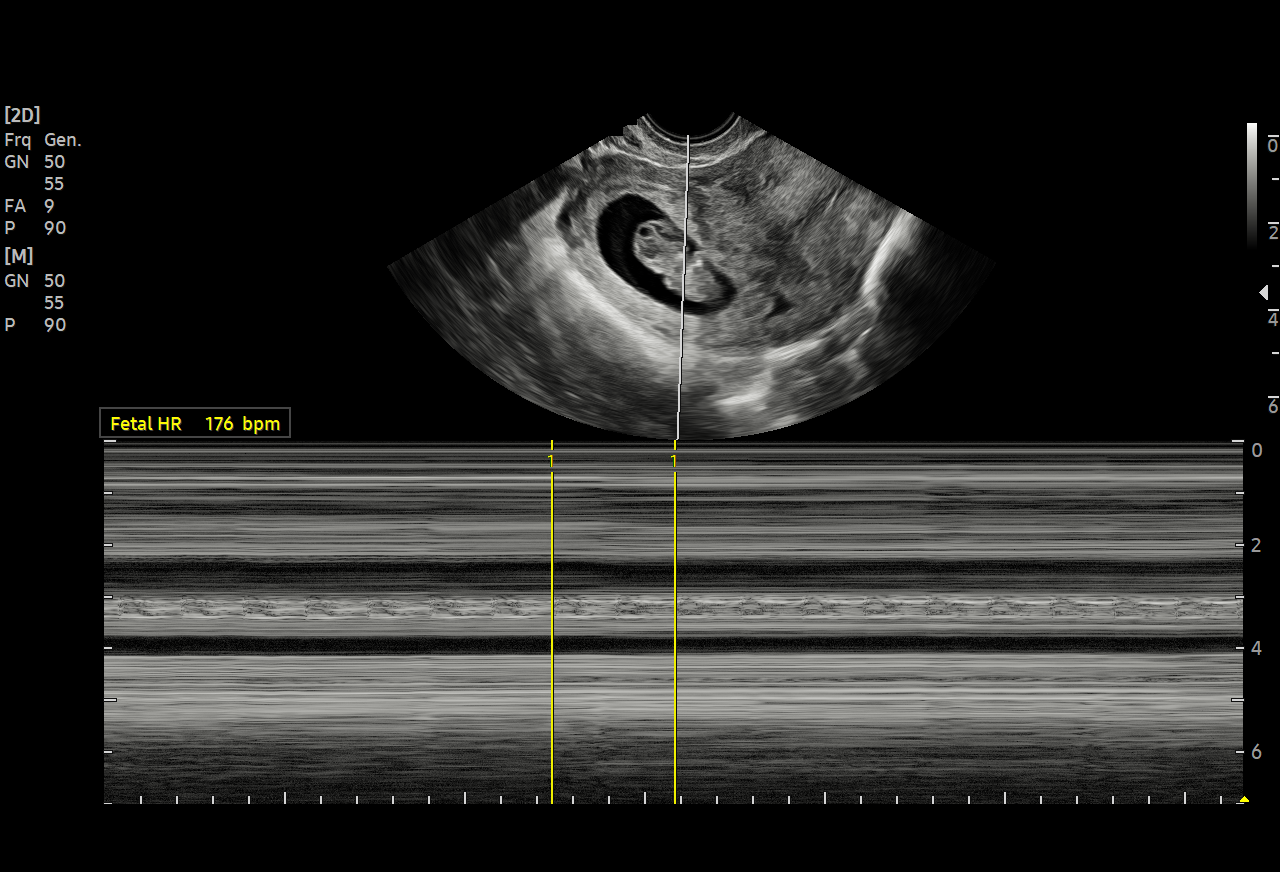
[im 2/5]
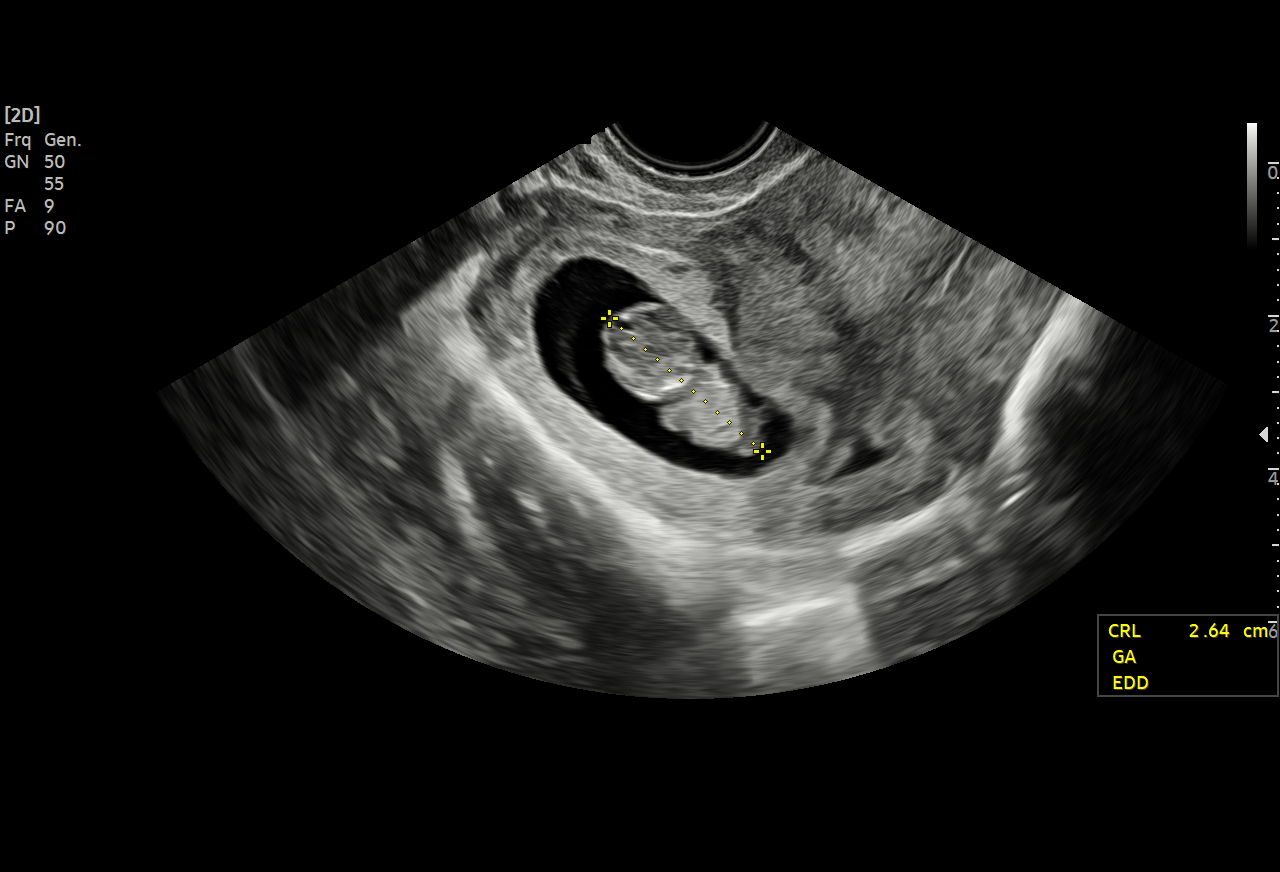
[im 3/5]
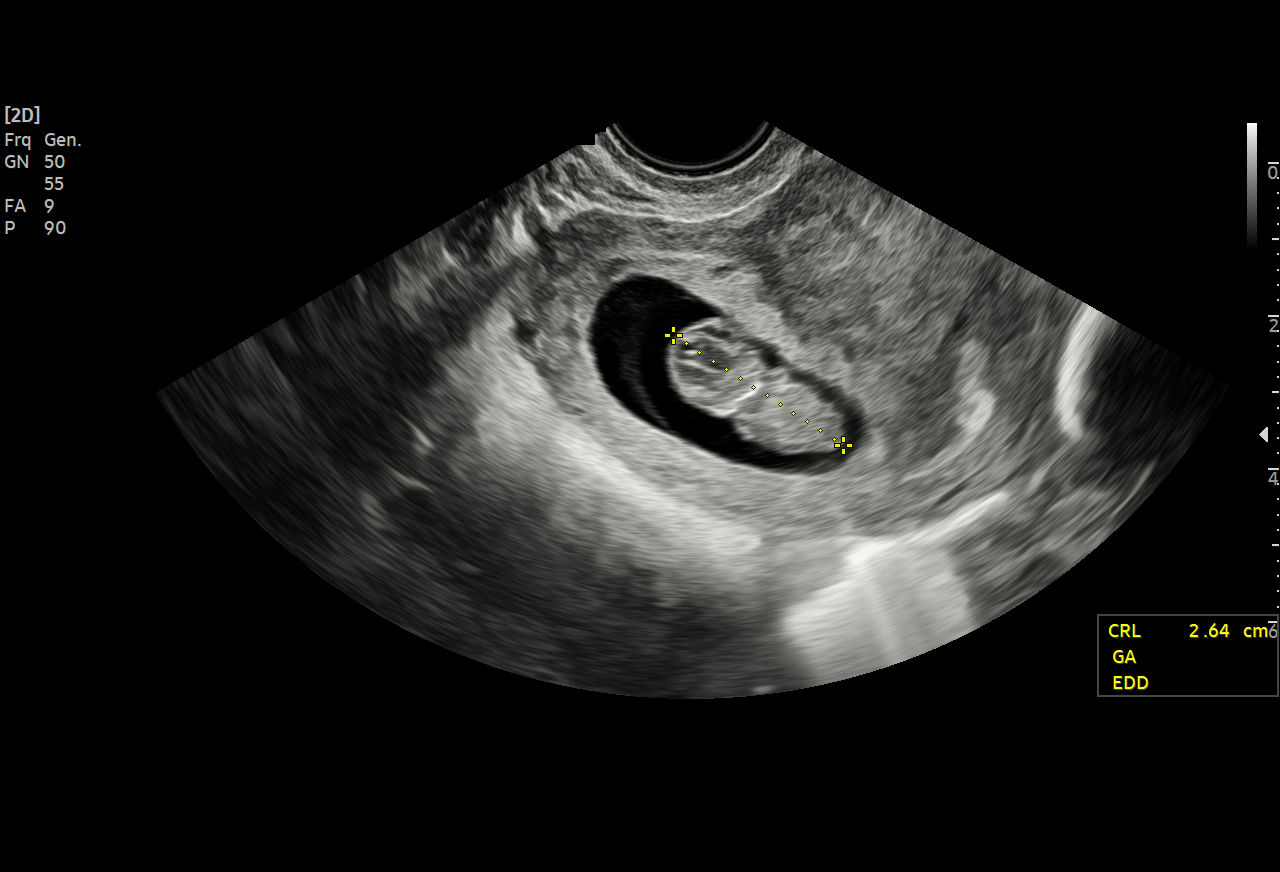
[im 4/5]
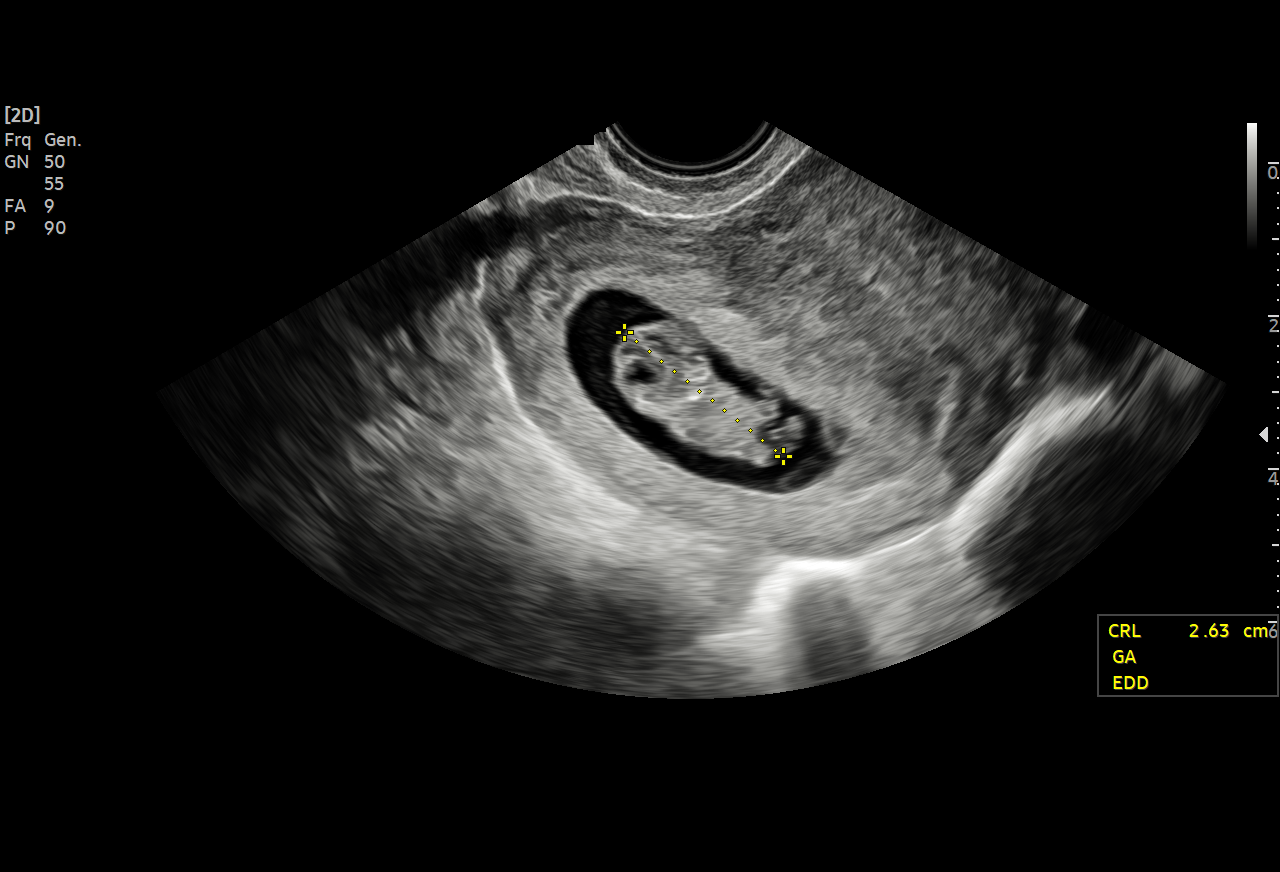
[im 5/5]
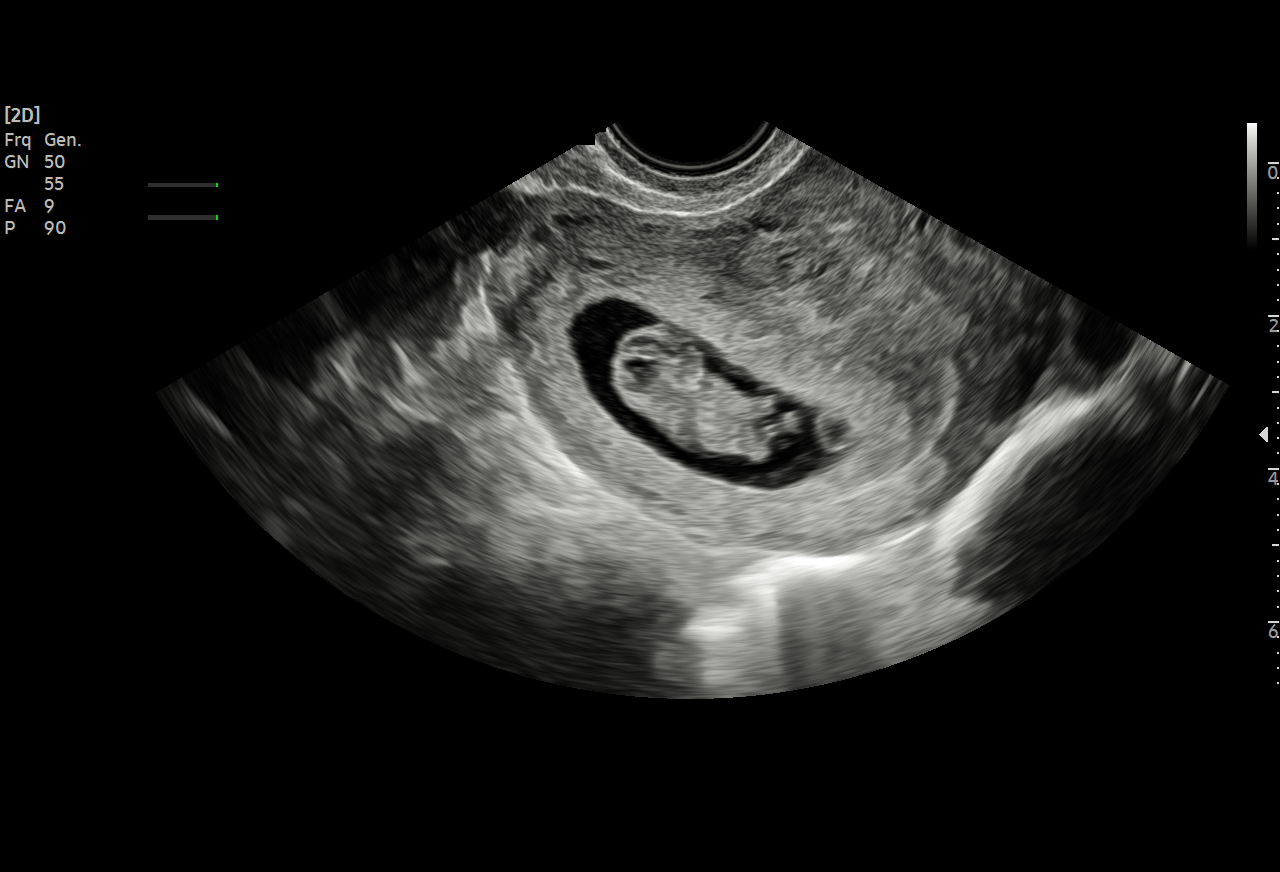

[5 of 5 positions shown; findings below may reference images not displayed]

[REDACTED]care

 1  [HOSPITAL]                         [address].0     MECAN MUKENDI

Indications

 9 weeks gestation of pregnancy
Fetal Evaluation

 Num Of Fetuses:          1
 Fetal Heart Rate(bpm):   176
 Cardiac Activity:        Observed
Biometry

 CRL:      26.4  mm     G. Age:  9w 2d                   EDD:    02/14/21
Gestational Age

 Best:          9w 2d      Det. By:  U/S C R L (07/14/20)     EDD:   02/14/21
Comments

 Single live IUP at 9w2d by CRL. LMP 05/02/20
Impression

 IUP 9 weeks 2 days
Recommendations

 Fetal anatomy scan at 18-20 weeks.

## 2023-08-24 DIAGNOSIS — S30860A Insect bite (nonvenomous) of lower back and pelvis, initial encounter: Secondary | ICD-10-CM | POA: Diagnosis not present

## 2023-08-24 DIAGNOSIS — S20462A Insect bite (nonvenomous) of left back wall of thorax, initial encounter: Secondary | ICD-10-CM | POA: Diagnosis not present

## 2023-08-24 DIAGNOSIS — S40862A Insect bite (nonvenomous) of left upper arm, initial encounter: Secondary | ICD-10-CM | POA: Diagnosis not present

## 2023-08-24 DIAGNOSIS — Z91038 Other insect allergy status: Secondary | ICD-10-CM | POA: Diagnosis not present

## 2023-11-13 DIAGNOSIS — S93401A Sprain of unspecified ligament of right ankle, initial encounter: Secondary | ICD-10-CM | POA: Diagnosis not present

## 2023-11-13 DIAGNOSIS — S01301A Unspecified open wound of right ear, initial encounter: Secondary | ICD-10-CM | POA: Diagnosis not present

## 2023-11-13 DIAGNOSIS — H9201 Otalgia, right ear: Secondary | ICD-10-CM | POA: Diagnosis not present

## 2023-11-13 DIAGNOSIS — M79671 Pain in right foot: Secondary | ICD-10-CM | POA: Diagnosis not present

## 2023-11-13 DIAGNOSIS — M25571 Pain in right ankle and joints of right foot: Secondary | ICD-10-CM | POA: Diagnosis not present

## 2023-12-05 ENCOUNTER — Ambulatory Visit (HOSPITAL_COMMUNITY)
Admission: EM | Admit: 2023-12-05 | Discharge: 2023-12-05 | Disposition: A | Discharge status: Home / Self Care | Attending: Family Medicine | Admitting: Family Medicine

## 2023-12-05 ENCOUNTER — Encounter (HOSPITAL_COMMUNITY): Payer: Self-pay

## 2023-12-05 DIAGNOSIS — L02412 Cutaneous abscess of left axilla: Secondary | ICD-10-CM

## 2023-12-05 MED ORDER — IBUPROFEN 600 MG PO TABS: 600.0000 mg | ORAL_TABLET | Freq: Four times a day (QID) | ORAL | 0 refills | Status: AC | PRN

## 2023-12-05 MED ORDER — AMOXICILLIN-POT CLAVULANATE 875-125 MG PO TABS: 1.0000 | ORAL_TABLET | Freq: Two times a day (BID) | ORAL | 0 refills | Status: AC

## 2023-12-05 NOTE — ED Provider Notes (Signed)
 [facility]    CSN: [id number] Arrival date & time: 12/05/23  9066      History   Chief Complaint Chief Complaint  Patient presents with   Abscess    HPI Vicki Harmon is a 23 y.o. female.    Abscess  Patient is here for an abscess to the left axilla.  Has been there x 3 months, but worsening recently.  Tenderness, raised, red.        Past Medical History:  Diagnosis Date   Chlamydia     Patient Active Problem List   Diagnosis Date Noted   Family discord 07/18/2021   Involuntary commitment 07/18/2021   Vaginal delivery 02/13/2021   Gestational hypertension, third trimester 02/12/2021   Alpha thalassemia silent carrier 11/23/2020   Encounter for supervision of normal pregnancy in third trimester 07/14/2020   Headache disorder 10/30/2017    Past Surgical History:  Procedure Laterality Date   NO PAST SURGERIES      OB History     Gravida  2   Para  2   Term  2   Preterm      AB      Living  2      SAB      IAB      Ectopic      Multiple  0   Live Births  2            Home Medications    Prior to Admission medications   Not on File    Family History Family History  Problem Relation Age of Onset   Hyperlipidemia Mother    Hypertension Mother     Social History Social History   Tobacco Use   Smoking status: Never   Smokeless tobacco: Never  Vaping Use   Vaping status: Some Days  Substance Use Topics   Alcohol use: Not Currently    Comment: occ   Drug use: Yes    Types: Marijuana     Allergies   Shellfish allergy   Review of Systems Review of Systems  Constitutional: Negative.   HENT: Negative.    Respiratory: Negative.    Cardiovascular: Negative.   Gastrointestinal: Negative.   Skin:  Positive for wound.     Physical Exam Triage Vital Signs ED Triage Vitals [12/05/23 1000]  Encounter Vitals Group     BP 121/75     Girls Systolic BP Percentile      Girls Diastolic BP Percentile       Boys Systolic BP Percentile      Boys Diastolic BP Percentile      Pulse Rate 76     Resp 16     Temp 98.2 F (36.8 C)     Temp Source Oral     SpO2 98 %     Weight      Height      Head Circumference      Peak Flow      Pain Score 7     Pain Loc      Pain Education      Exclude from Growth Chart    No data found.  Updated Vital Signs BP 121/75 (BP Location: Right Arm)   Pulse 76   Temp 98.2 F (36.8 C) (Oral)   Resp 16   LMP 11/29/2023 (Approximate)   SpO2 98%   Breastfeeding No Comment: lactating  Visual Acuity Right Eye Distance:   Left Eye Distance:   Bilateral Distance:  Right Eye Near:   Left Eye Near:    Bilateral Near:     Physical Exam Constitutional:      Appearance: Normal appearance. She is normal weight.  Skin:    Comments: To the left axilla is a raised, tender lump;  mild erythema;  no changes to the skin noted  Neurological:     General: No focal deficit present.     Mental Status: She is alert.  Psychiatric:        Mood and Affect: Mood normal.      UC Treatments / Results  Labs (all labs ordered are listed, but only abnormal results are displayed) Labs Reviewed - No data to display  EKG   Radiology No results found.  Procedures Procedures (including critical care time)  Medications Ordered in UC Medications - No data to display  Initial Impression / Assessment and Plan / UC Course  I have reviewed the triage vital signs and the nursing notes.  Pertinent labs & imaging results that were available during my care of the patient were reviewed by me and considered in my medical decision making (see chart for details).   Final Clinical Impressions(s) / UC Diagnoses   Final diagnoses:  Abscess of left axilla     Discharge Instructions      You were seen today for an abscess to the left arm pit.  It is not ready to be opened/drained today.  I have sent out an oral antibiotic and motrin  for pain.  Please use warm  compresses to the area as well.  If this worsens over the next several days, and comes to more of a head, then please return for re-evaluation.      ED Prescriptions     Medication Sig Dispense Auth. Provider   amoxicillin -clavulanate (AUGMENTIN ) 875-125 MG tablet Take 1 tablet by mouth every 12 (twelve) hours for 10 days. 20 tablet Rollie Hynek, MD   ibuprofen  (ADVIL ) 600 MG tablet Take 1 tablet (600 mg total) by mouth every 6 (six) hours as needed. 30 tablet Darral Longs, MD      PDMP not reviewed this encounter.   Darral Longs, MD 12/05/23 1019

## 2023-12-05 NOTE — ED Triage Notes (Signed)
 Patient here today with c/o left axilla pain, swelling, warmth, and redness X 3 months. Patient has been taking Tylenol  and running warm water on it with some relief. Patient states that it has been worsening recently.

## 2023-12-05 NOTE — Discharge Instructions (Addendum)
 You were seen today for an abscess to the left arm pit.  It is not ready to be opened/drained today.  I have sent out an oral antibiotic and motrin  for pain.  Please use warm compresses to the area as well.  If this worsens over the next several days, and comes to more of a head, then please return for re-evaluation.

## 2023-12-08 ENCOUNTER — Ambulatory Visit (HOSPITAL_COMMUNITY)
Admission: EM | Admit: 2023-12-08 | Discharge: 2023-12-08 | Disposition: A | Discharge status: Home / Self Care | Attending: Internal Medicine | Admitting: Internal Medicine

## 2023-12-08 ENCOUNTER — Encounter (HOSPITAL_COMMUNITY): Payer: Self-pay

## 2023-12-08 DIAGNOSIS — L720 Epidermal cyst: Secondary | ICD-10-CM

## 2023-12-08 DIAGNOSIS — L02412 Cutaneous abscess of left axilla: Secondary | ICD-10-CM

## 2023-12-08 MED ORDER — LIDOCAINE-EPINEPHRINE 1 %-1:100000 IJ SOLN
INTRAMUSCULAR | Status: AC
  Filled 2023-12-08: qty 1

## 2023-12-08 NOTE — Discharge Instructions (Addendum)
 Symptoms and physical exam findings are consistent with an infected epidermal inclusion cyst that has failed treatment with antibiotics.  We have done an incision and drainage today.  As this is a cyst, you will notice a small knot will stay in the area after it is healed.  In order to completely remove the cyst, you will need to follow-up with a general surgeon to discuss formal excision.  Recommend continuing antibiotics as previously prescribed.  Keep a dressing on the area as there will be some drainage for the next several days.  Change dressing twice a day.  May shower and wash the area with soap and water but do not recommend swimming until the area is completely healed.  Contact the surgeons office or your primary care physician if a referral is needed to discuss excision of the cyst.  May return to urgent care if symptoms worsen.

## 2023-12-08 NOTE — ED Provider Notes (Addendum)
 [facility]    CSN: [id number] Arrival date & time: 12/08/23  1122      History   Chief Complaint Chief Complaint  Patient presents with   Follow-up    HPI Vicki Harmon is a 23 y.o. female.   23 year old female presents urgent care with complaints of left axilla pain, swelling and redness.  She was seen on September 25 and started on antibiotics.  She was told at that time if the area came to ahead or was not improving to come back.  She reports that she noticed a Helen Winterhalter area and a small amount of drainage but mainly came back because the area had gotten more swollen and was very painful.  She denies any fevers or chills. The area was present for about 3 months before it began causing problems.     Past Medical History:  Diagnosis Date   Chlamydia     Patient Active Problem List   Diagnosis Date Noted   Family discord 07/18/2021   Involuntary commitment 07/18/2021   Vaginal delivery 02/13/2021   Gestational hypertension, third trimester 02/12/2021   Alpha thalassemia silent carrier 11/23/2020   Encounter for supervision of normal pregnancy in third trimester 07/14/2020   Headache disorder 10/30/2017    Past Surgical History:  Procedure Laterality Date   NO PAST SURGERIES      OB History     Gravida  2   Para  2   Term  2   Preterm      AB      Living  2      SAB      IAB      Ectopic      Multiple  0   Live Births  2            Home Medications    Prior to Admission medications   Medication Sig Start Date End Date Taking? Authorizing Provider  amoxicillin -clavulanate (AUGMENTIN ) 875-125 MG tablet Take 1 tablet by mouth every 12 (twelve) hours for 10 days. 12/05/23 12/15/23 Yes Piontek, Erin, MD  ibuprofen  (ADVIL ) 600 MG tablet Take 1 tablet (600 mg total) by mouth every 6 (six) hours as needed. 12/05/23  Yes Piontek, Rocky, MD    Family History Family History  Problem Relation Age of Onset   Hyperlipidemia Mother     Hypertension Mother     Social History Social History   Tobacco Use   Smoking status: Never   Smokeless tobacco: Never  Vaping Use   Vaping status: Some Days  Substance Use Topics   Alcohol use: Not Currently    Comment: occ   Drug use: Yes    Types: Marijuana     Allergies   Shellfish allergy   Review of Systems Review of Systems  Constitutional:  Negative for chills and fever.  HENT:  Negative for ear pain and sore throat.   Eyes:  Negative for pain and visual disturbance.  Respiratory:  Negative for cough and shortness of breath.   Cardiovascular:  Negative for chest pain and palpitations.  Gastrointestinal:  Negative for abdominal pain and vomiting.  Genitourinary:  Negative for dysuria and hematuria.  Musculoskeletal:  Negative for arthralgias and back pain.  Skin:  Negative for color change and rash.       Left axilla pain and swelling with redness  Neurological:  Negative for seizures and syncope.  All other systems reviewed and are negative.    Physical Exam Triage Vital Signs ED  Triage Vitals  Encounter Vitals Group     BP 12/08/23 1302 110/67     Girls Systolic BP Percentile --      Girls Diastolic BP Percentile --      Boys Systolic BP Percentile --      Boys Diastolic BP Percentile --      Pulse Rate 12/08/23 1302 70     Resp 12/08/23 1302 16     Temp 12/08/23 1302 98.1 F (36.7 C)     Temp Source 12/08/23 1302 Oral     SpO2 12/08/23 1302 99 %     Weight --      Height --      Head Circumference --      Peak Flow --      Pain Score 12/08/23 1259 10     Pain Loc --      Pain Education --      Exclude from Growth Chart --    No data found.  Updated Vital Signs BP 110/67 (BP Location: Right Arm)   Pulse 70   Temp 98.1 F (36.7 C) (Oral)   Resp 16   LMP 11/29/2023 (Approximate)   SpO2 99%   Visual Acuity Right Eye Distance:   Left Eye Distance:   Bilateral Distance:    Right Eye Near:   Left Eye Near:    Bilateral Near:      Physical Exam Vitals and nursing note reviewed.  Constitutional:      General: She is not in acute distress.    Appearance: She is well-developed.  HENT:     Head: Normocephalic and atraumatic.  Eyes:     Conjunctiva/sclera: Conjunctivae normal.  Cardiovascular:     Rate and Rhythm: Normal rate and regular rhythm.     Heart sounds: No murmur heard. Pulmonary:     Effort: Pulmonary effort is normal. No respiratory distress.     Breath sounds: Normal breath sounds.  Chest:    Abdominal:     Palpations: Abdomen is soft.     Tenderness: There is no abdominal tenderness.  Musculoskeletal:        General: No swelling.     Cervical back: Neck supple.  Skin:    General: Skin is warm and dry.     Capillary Refill: Capillary refill takes less than 2 seconds.  Neurological:     Mental Status: She is alert.  Psychiatric:        Mood and Affect: Mood normal.      UC Treatments / Results  Labs (all labs ordered are listed, but only abnormal results are displayed) Labs Reviewed - No data to display  EKG   Radiology No results found.  Procedures Incision and Drainage  Date/Time: 12/08/2023 2:06 PM  Performed by: Teresa Almarie LABOR, PA-C Authorized by: Teresa Almarie LABOR, PA-C   Consent:    Consent obtained:  Verbal   Consent given by:  Patient   Risks, benefits, and alternatives were discussed: yes     Risks discussed:  Bleeding, incomplete drainage, pain, infection and damage to other organs   Alternatives discussed:  No treatment Universal protocol:    Procedure explained and questions answered to patient or proxy's satisfaction: yes     Relevant documents present and verified: yes     Site/side marked: yes     Immediately prior to procedure, a time out was called: yes     Patient identity confirmed:  Verbally with patient Location:    Type:  Abscess   Size:  4 cm   Location:  Upper extremity   Upper extremity location: Left axilla. Pre-procedure details:     Skin preparation:  Povidone-iodine Procedure type:    Complexity:  Simple Procedure details:    Incision types:  Cruciate   Incision depth:  Dermal   Wound management:  Probed and deloculated   Drainage:  Purulent   Drainage amount:  Copious   Wound treatment:  Wound left open   Packing materials:  None Post-procedure details:    Procedure completion:  Tolerated well, no immediate complications  (including critical care time)  Medications Ordered in UC Medications - No data to display  Initial Impression / Assessment and Plan / UC Course  I have reviewed the triage vital signs and the nursing notes.  Pertinent labs & imaging results that were available during my care of the patient were reviewed by me and considered in my medical decision making (see chart for details).     Epidermal inclusion cyst  Abscess of left axilla   Symptoms and physical exam findings are consistent with an infected epidermal inclusion cyst that has failed treatment with antibiotics.  We have done an incision and drainage today.  As this is a cyst, you will notice a small knot will stay in the area after it is healed.  In order to completely remove the cyst, you will need to follow-up with a general surgeon to discuss formal excision.  Recommend continuing antibiotics as previously prescribed.  Keep a dressing on the area as there will be some drainage for the next several days.  Change dressing twice a day.  May shower and wash the area with soap and water but do not recommend swimming until the area is completely healed.  Contact the surgeons office or your primary care physician if a referral is needed to discuss excision of the cyst.  May return to urgent care if symptoms worsen.  Final Clinical Impressions(s) / UC Diagnoses   Final diagnoses:  Epidermal inclusion cyst  Abscess of left axilla     Discharge Instructions      Symptoms and physical exam findings are consistent with an infected  epidermal inclusion cyst that has failed treatment with antibiotics.  We have done an incision and drainage today.  As this is a cyst, you will notice a small knot will stay in the area after it is healed.  In order to completely remove the cyst, you will need to follow-up with a general surgeon to discuss formal excision.  Recommend continuing antibiotics as previously prescribed.  Keep a dressing on the area as there will be some drainage for the next several days.  Change dressing twice a day.  May shower and wash the area with soap and water but do not recommend swimming until the area is completely healed.  Contact the surgeons office or your primary care physician if a referral is needed to discuss excision of the cyst.  May return to urgent care if symptoms worsen.    ED Prescriptions   None    PDMP not reviewed this encounter.   Teresa Almarie LABOR, PA-C 12/08/23 1406    Teresa Almarie LABOR, PA-C 12/08/23 1407

## 2023-12-08 NOTE — ED Triage Notes (Signed)
 Patient here today for a follow up of left axilla abscess. Patient has been taking her medication but feels like it is worsening.

## 2024-01-25 ENCOUNTER — Ambulatory Visit (HOSPITAL_COMMUNITY)
Admission: EM | Admit: 2024-01-25 | Discharge: 2024-01-25 | Disposition: A | Discharge status: Home / Self Care | Attending: Emergency Medicine | Admitting: Emergency Medicine

## 2024-01-25 ENCOUNTER — Encounter (HOSPITAL_COMMUNITY): Payer: Self-pay

## 2024-01-25 DIAGNOSIS — N898 Other specified noninflammatory disorders of vagina: Secondary | ICD-10-CM | POA: Insufficient documentation

## 2024-01-25 DIAGNOSIS — Z113 Encounter for screening for infections with a predominantly sexual mode of transmission: Secondary | ICD-10-CM | POA: Insufficient documentation

## 2024-01-25 DIAGNOSIS — R300 Dysuria: Secondary | ICD-10-CM | POA: Diagnosis not present

## 2024-01-25 DIAGNOSIS — Z3202 Encounter for pregnancy test, result negative: Secondary | ICD-10-CM

## 2024-01-25 LAB — POCT URINALYSIS DIP (MANUAL ENTRY)
Bilirubin, UA: NEGATIVE
Glucose, UA: NEGATIVE mg/dL
Ketones, POC UA: NEGATIVE mg/dL
Nitrite, UA: NEGATIVE
Protein Ur, POC: 100 mg/dL — AB
Spec Grav, UA: >=1.03 — AB (ref 1.010–1.025)
Urobilinogen, UA: 1 U/dL — AB
pH, UA: 6 (ref 5.0–8.0)

## 2024-01-25 LAB — POCT URINE PREGNANCY: Preg Test, Ur: NEGATIVE

## 2024-01-25 NOTE — ED Provider Notes (Signed)
 [facility]    CSN: [id number] Arrival date & time: 01/25/24  9071      History   Chief Complaint Chief Complaint  Patient presents with   Dysuria   Abdominal Pain   Vaginal Discharge    HPI Jazmine Heckman is a 23 y.o. female.   Patient presents with dysuria, intermittent lower abdominal pain, and white vaginal discharge that began yesterday.  Patient denies vaginal pain, vaginal lesions, urinary frequency/urgency, hematuria, abnormal vaginal bleeding, flank pain, or fever.    Patient reports that she is sexually active.  Patient denies any known exposures to STDs. Patient reports she has been taking ibuprofen  with relief of her lower abdominal pain.  Patient reports that she does have a Nexplanon  implant and she believes her LMP was 2 weeks ago.  The history is provided by the patient and medical records.  Dysuria Associated symptoms: abdominal pain and vaginal discharge   Abdominal Pain Associated symptoms: dysuria and vaginal discharge   Vaginal Discharge Associated symptoms: abdominal pain and dysuria     Past Medical History:  Diagnosis Date   Chlamydia     Patient Active Problem List   Diagnosis Date Noted   Family discord 07/18/2021   Involuntary commitment 07/18/2021   Vaginal delivery 02/13/2021   Gestational hypertension, third trimester 02/12/2021   Alpha thalassemia silent carrier 11/23/2020   Encounter for supervision of normal pregnancy in third trimester 07/14/2020   Headache disorder 10/30/2017    Past Surgical History:  Procedure Laterality Date   NO PAST SURGERIES      OB History     Gravida  2   Para  2   Term  2   Preterm      AB      Living  2      SAB      IAB      Ectopic      Multiple  0   Live Births  2            Home Medications    Prior to Admission medications   Medication Sig Start Date End Date Taking? Authorizing Provider  ibuprofen  (ADVIL ) 600 MG tablet Take 1 tablet (600 mg total)  by mouth every 6 (six) hours as needed. 12/05/23   Darral Longs, MD    Family History Family History  Problem Relation Age of Onset   Hyperlipidemia Mother    Hypertension Mother     Social History Social History   Tobacco Use   Smoking status: Never   Smokeless tobacco: Never  Vaping Use   Vaping status: Some Days  Substance Use Topics   Alcohol use: Not Currently    Comment: occ   Drug use: Yes    Types: Marijuana     Allergies   Shellfish allergy   Review of Systems Review of Systems  Gastrointestinal:  Positive for abdominal pain.  Genitourinary:  Positive for dysuria and vaginal discharge.   Per HPI  Physical Exam Triage Vital Signs ED Triage Vitals [01/25/24 1000]  Encounter Vitals Group     BP 118/71     Girls Systolic BP Percentile      Girls Diastolic BP Percentile      Boys Systolic BP Percentile      Boys Diastolic BP Percentile      Pulse Rate 67     Resp 14     Temp (!) 97.5 F (36.4 C)     Temp Source Oral  SpO2 99 %     Weight      Height      Head Circumference      Peak Flow      Pain Score 0     Pain Loc      Pain Education      Exclude from Growth Chart    No data found.  Updated Vital Signs BP 118/71 (BP Location: Right Arm)   Pulse 67   Temp (!) 97.5 F (36.4 C) (Oral)   Resp 14   SpO2 99%   Visual Acuity Right Eye Distance:   Left Eye Distance:   Bilateral Distance:    Right Eye Near:   Left Eye Near:    Bilateral Near:     Physical Exam Vitals and nursing note reviewed.  Constitutional:      General: She is awake. She is not in acute distress.    Appearance: Normal appearance. She is well-developed and well-groomed. She is not ill-appearing.  Abdominal:     General: Abdomen is flat. Bowel sounds are normal. There is no distension.     Palpations: Abdomen is soft.     Tenderness: There is no abdominal tenderness. There is no right CVA tenderness, left CVA tenderness, guarding or rebound.   Genitourinary:    Comments: Exam deferred Skin:    General: Skin is warm and dry.  Neurological:     Mental Status: She is alert.  Psychiatric:        Behavior: Behavior is cooperative.      UC Treatments / Results  Labs (all labs ordered are listed, but only abnormal results are displayed) Labs Reviewed  POCT URINALYSIS DIP (MANUAL ENTRY) - Abnormal; Notable for the following components:      Result Value   Clarity, UA hazy (*)    Spec Grav, UA >=1.030 (*)    Blood, UA small (*)    Protein Ur, POC =100 (*)    Urobilinogen, UA 1.0 (*)    Leukocytes, UA Small (1+) (*)    All other components within normal limits  POCT URINE PREGNANCY - Normal  URINE CULTURE  HIV ANTIBODY (ROUTINE TESTING W REFLEX)  RPR  CERVICOVAGINAL ANCILLARY ONLY    EKG   Radiology No results found.  Procedures Procedures (including critical care time)  Medications Ordered in UC Medications - No data to display  Initial Impression / Assessment and Plan / UC Course  I have reviewed the triage vital signs and the nursing notes.  Pertinent labs & imaging results that were available during my care of the patient were reviewed by me and considered in my medical decision making (see chart for details).     Patient is overall well-appearing.  Vitals are stable.  No significant findings on exam.  GU exam deferred.  Patient perform self swab for STD/STI.  HIV and RPR ordered.  UPT negative.  Urinalysis revealed small leukocytes but wonder if this is related to contamination from vaginal discharge.  Will send urine culture to confirm.  Deferred empirical treatment at this time.  Discussed follow-up and return precautions. Final Clinical Impressions(s) / UC Diagnoses   Final diagnoses:  Dysuria  Vaginal discharge  Screen for STD (sexually transmitted disease)     Discharge Instructions      As discussed your urinalysis is not super indicative of a urinary tract infection at this time.  We  will send a urine culture to confirm presence of this.  Your urine culture, swab  results, and blood work will come back over the next few days and someone will call if results are positive or require any additional treatment.   ED Prescriptions   None    PDMP not reviewed this encounter.   Johnie Flaming A, NP 01/25/24 [phone]

## 2024-01-25 NOTE — Discharge Instructions (Signed)
 As discussed your urinalysis is not super indicative of a urinary tract infection at this time.  We will send a urine culture to confirm presence of this.  Your urine culture, swab results, and blood work will come back over the next few days and someone will call if results are positive or require any additional treatment.

## 2024-01-25 NOTE — ED Triage Notes (Signed)
 Patient reports that she has had dysuria, lower abdominal pain, and a white vaginal discharge since yesterday.  Patient  reports that she has beent aking Ibuprofen  for pain.

## 2024-01-26 LAB — SYPHILIS: RPR W/REFLEX TO RPR TITER AND TREPONEMAL ANTIBODIES, TRADITIONAL SCREENING AND DIAGNOSIS ALGORITHM: RPR Ser Ql: NONREACTIVE

## 2024-01-27 ENCOUNTER — Ambulatory Visit (HOSPITAL_COMMUNITY): Payer: Self-pay

## 2024-01-27 LAB — CERVICOVAGINAL ANCILLARY ONLY
Bacterial Vaginitis (gardnerella): POSITIVE — AB
Candida Glabrata: NEGATIVE
Candida Vaginitis: NEGATIVE
Chlamydia: NEGATIVE
Comment: NEGATIVE
Comment: NEGATIVE
Comment: NEGATIVE
Comment: NEGATIVE
Comment: NEGATIVE
Comment: NORMAL
Neisseria Gonorrhea: NEGATIVE
Trichomonas: NEGATIVE

## 2024-01-27 LAB — URINE CULTURE: Culture: >=100000 — AB

## 2024-01-27 MED ORDER — CIPROFLOXACIN HCL 250 MG PO TABS
250.0000 mg | ORAL_TABLET | Freq: Two times a day (BID) | ORAL | 0 refills | Status: AC
Start: 2024-01-27 — End: 2024-02-01

## 2024-01-27 MED ORDER — METRONIDAZOLE 500 MG PO TABS: 500.0000 mg | ORAL_TABLET | Freq: Two times a day (BID) | ORAL | 0 refills | Status: AC

## 2024-01-27 NOTE — Telephone Encounter (Signed)
 Prescription for ciprofloxacin sent to [facility] pharmacy in [facility].

## 2024-01-27 NOTE — Addendum Note (Signed)
 Addended by: ARNALDO ALFONSO RAMAN on: 01/27/2024 04:34 PM   Modules accepted: Orders

## 2024-01-28 LAB — MISC LABCORP TEST (SEND OUT): Labcorp test code: 83935
